# Patient Record
Sex: Female | Born: 2000
Health system: Southern US, Community
[De-identification: ages and names within clinical notes are randomized; demographics above are authoritative.]

## PROBLEM LIST (undated history)

## (undated) DIAGNOSIS — R630 Anorexia: Secondary | ICD-10-CM

## (undated) DIAGNOSIS — F329 Major depressive disorder, single episode, unspecified: Secondary | ICD-10-CM

## (undated) DIAGNOSIS — R519 Headache, unspecified: Secondary | ICD-10-CM

## (undated) DIAGNOSIS — G43909 Migraine, unspecified, not intractable, without status migrainosus: Secondary | ICD-10-CM

## (undated) DIAGNOSIS — F509 Eating disorder, unspecified: Secondary | ICD-10-CM

## (undated) DIAGNOSIS — N390 Urinary tract infection, site not specified: Secondary | ICD-10-CM

## (undated) DIAGNOSIS — G8929 Other chronic pain: Secondary | ICD-10-CM

## (undated) DIAGNOSIS — T7840XA Allergy, unspecified, initial encounter: Secondary | ICD-10-CM

## (undated) DIAGNOSIS — B2790 Infectious mononucleosis, unspecified without complication: Secondary | ICD-10-CM

## (undated) DIAGNOSIS — Z9889 Other specified postprocedural states: Secondary | ICD-10-CM

## (undated) DIAGNOSIS — R112 Nausea with vomiting, unspecified: Secondary | ICD-10-CM

## (undated) HISTORY — PX: TONSILLECTOMY AND ADENOIDECTOMY: SUR1326

## (undated) HISTORY — DX: Infectious mononucleosis, unspecified without complication: B27.90

## (undated) HISTORY — DX: Anorexia: R63.0

## (undated) HISTORY — DX: Urinary tract infection, site not specified: N39.0

## (undated) HISTORY — DX: Headache, unspecified: R51.9

## (undated) HISTORY — DX: Migraine, unspecified, not intractable, without status migrainosus: G43.909

## (undated) HISTORY — PX: FOOT SURGERY: SHX648

## (undated) HISTORY — DX: Allergy, unspecified, initial encounter: T78.40XA

## (undated) HISTORY — PX: TONSILLECTOMY: SUR1361

## (undated) HISTORY — DX: Other chronic pain: G89.29

## (undated) HISTORY — PX: TYMPANOSTOMY TUBE PLACEMENT: SHX32

## (undated) HISTORY — PX: WISDOM TOOTH EXTRACTION: SHX21

---
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
## 2020-01-05 ENCOUNTER — Telehealth: Payer: Self-pay | Admitting: Internal Medicine

## 2020-01-05 NOTE — Telephone Encounter (Signed)
Left message to return call 

## 2020-01-05 NOTE — Telephone Encounter (Signed)
The patient is needing refills on her FLUoxetine (PROZAC) 20 MG capsule  and buPROPion (WELLBUTRIN XL) 150 MG 24 hr tablet. Please call the patient to establish what pharmacy to call near her school to send the refills.

## 2020-01-05 NOTE — Telephone Encounter (Signed)
Mychart has also been sent to ask patient which pharmacy near her school she wants her medication sent to.

## 2020-01-09 ENCOUNTER — Other Ambulatory Visit: Payer: Self-pay

## 2020-01-09 ENCOUNTER — Other Ambulatory Visit: Payer: Self-pay | Admitting: *Deleted

## 2020-01-09 DIAGNOSIS — F32A Depression, unspecified: Secondary | ICD-10-CM

## 2020-01-09 DIAGNOSIS — F419 Anxiety disorder, unspecified: Secondary | ICD-10-CM

## 2020-01-09 MED ORDER — BUPROPION HCL ER (XL) 150 MG PO TB24: 150.0000 mg | ORAL_TABLET | Freq: Every day | ORAL | 1 refills | Status: DC

## 2020-01-09 MED ORDER — FLUOXETINE HCL 20 MG PO CAPS: 20.0000 mg | ORAL_CAPSULE | Freq: Every day | ORAL | 0 refills | Status: DC

## 2020-01-09 NOTE — Telephone Encounter (Signed)
Medication has been ordered to [facility] with instructions to come in for a face to face visit before they receive another refill.

## 2020-01-09 NOTE — Telephone Encounter (Signed)
Patient is coming home so please send medication to [facility].

## 2020-01-13 ENCOUNTER — Ambulatory Visit (INDEPENDENT_AMBULATORY_CARE_PROVIDER_SITE_OTHER): Payer: BC Managed Care – PPO | Admitting: Obstetrics & Gynecology

## 2020-01-13 ENCOUNTER — Other Ambulatory Visit: Payer: Self-pay

## 2020-01-13 ENCOUNTER — Encounter: Payer: Self-pay | Admitting: Obstetrics & Gynecology

## 2020-01-13 ENCOUNTER — Other Ambulatory Visit (HOSPITAL_COMMUNITY)
Admission: RE | Admit: 2020-01-13 | Discharge: 2020-01-13 | Disposition: A | Discharge status: Home / Self Care | Payer: BC Managed Care – PPO | Source: Ambulatory Visit | Attending: Obstetrics & Gynecology | Admitting: Obstetrics & Gynecology

## 2020-01-13 ENCOUNTER — Other Ambulatory Visit: Payer: Self-pay | Admitting: Obstetrics & Gynecology

## 2020-01-13 VITALS — BP 106/70 | HR 82 | Ht 63.0 in | Wt 138.6 lb

## 2020-01-13 DIAGNOSIS — T8332XA Displacement of intrauterine contraceptive device, initial encounter: Secondary | ICD-10-CM

## 2020-01-13 DIAGNOSIS — N921 Excessive and frequent menstruation with irregular cycle: Secondary | ICD-10-CM | POA: Diagnosis not present

## 2020-01-13 DIAGNOSIS — Z23 Encounter for immunization: Secondary | ICD-10-CM

## 2020-01-13 DIAGNOSIS — Z113 Encounter for screening for infections with a predominantly sexual mode of transmission: Secondary | ICD-10-CM | POA: Insufficient documentation

## 2020-01-13 DIAGNOSIS — Z0000 Encounter for general adult medical examination without abnormal findings: Secondary | ICD-10-CM

## 2020-01-13 DIAGNOSIS — E559 Vitamin D deficiency, unspecified: Secondary | ICD-10-CM

## 2020-01-13 DIAGNOSIS — N939 Abnormal uterine and vaginal bleeding, unspecified: Secondary | ICD-10-CM | POA: Insufficient documentation

## 2020-01-13 DIAGNOSIS — Z975 Presence of (intrauterine) contraceptive device: Secondary | ICD-10-CM

## 2020-01-13 LAB — CBC
Hematocrit: 40.1 % (ref 34.0–46.6)
Hemoglobin: 13.6 g/dL (ref 11.1–15.9)
MCH: 29.6 pg (ref 26.6–33.0)
MCHC: 33.9 g/dL (ref 31.5–35.7)
MCV: 87 fL (ref 79–97)
Platelets: 331 10*3/uL (ref 150–450)
RBC: 4.59 x10E6/uL (ref 3.77–5.28)
RDW: 11.7 % (ref 11.7–15.4)
WBC: 6.4 10*3/uL (ref 3.4–10.8)

## 2020-01-13 MED ORDER — NORETHINDRONE-ETH ESTRADIOL 1-35 MG-MCG PO TABS: 1.0000 | ORAL_TABLET | Freq: Every day | ORAL | 11 refills | Status: DC

## 2020-01-13 NOTE — Progress Notes (Signed)
GYNECOLOGY OFFICE VISIT NOTE  History:   Andrea Stevens is a 19 y.o. G0P0000 here today for evaluation of continued AUB despite having [name] in place since 01/2019 and initiation of continuous Loestrin Fe in 09/2019.  Was seen in 10/2019 and had no bleeding, but AUB resumed later that month.  Reports bleeding about 5-6 days/week with 2-3 days of heavy bleeding where she uses a pad every 1.5 hours. Has associated cramping. Feels tired, wants to make sure she is not anemic. Reports having spotting today. She denies any other concerns.    Past Medical History:  Diagnosis Date  . Allergy   . Anorexia    age 48 yo as of 19 y.o not active   . Chronic headaches   . Depression, major, single episode   . Eating disorder 2015   resolved.  . Migraines   . PONV (postoperative nausea and vomiting)     Past Surgical History:  Procedure Laterality Date  . FOOT SURGERY     tailors bunion 2019  . GANGLION CYST EXCISION Left 07/30/2018   Procedure: REMOVAL GANGLION OF LEFT DORSAL WRIST;  Surgeon: Leanora Cover, MD;  Location: [place];  Service: Orthopedics;  Laterality: Left;  bier block  . TONSILLECTOMY     2008  . TONSILLECTOMY AND ADENOIDECTOMY    . TYMPANOSTOMY TUBE PLACEMENT    . WISDOM TOOTH EXTRACTION      The following portions of the patient's history were reviewed and updated as appropriate: allergies, current medications, past family history, past medical history, past social history, past surgical history and problem list.    Review of Systems:  Pertinent items noted in HPI and remainder of comprehensive ROS otherwise negative.  Physical Exam:  BP 106/70   Pulse 82   Ht 5\' 3"  (1.6 m)   Wt 138 lb 9.6 oz (62.9 kg)   BMI 24.55 kg/m  CONSTITUTIONAL: Well-developed, well-nourished female in no acute distress.  HEENT:  Normocephalic, atraumatic. External right and left ear normal. No scleral icterus.  NECK: Normal range of motion, supple, no masses noted on  observation SKIN: No rash noted. Not diaphoretic. No erythema. No pallor. MUSCULOSKELETAL: Normal range of motion. No edema noted. NEUROLOGIC: Alert and oriented to person, place, and time. Normal muscle tone coordination. No cranial nerve deficit noted. PSYCHIATRIC: Normal mood and affect. Normal behavior. Normal judgment and thought content. CARDIOVASCULAR: Normal heart rate noted RESPIRATORY: Effort and breath sounds normal, no problems with respiration noted ABDOMEN: No masses noted. No other overt distention noted.   PELVIC: Normal appearing external genitalia; normal urethral meatus; normal appearing vaginal mucosa and cervix.  No abnormal discharge or blood seen noted. IUD strings not visualized.  Performed in the presence of a chaperone     Assessment and Plan:     1. Breakthrough bleeding with IUD 2. Abnormal uterine bleeding (AUB) 3. Intrauterine contraceptive device threads lost, initial encounter IUD strings not seen, ultrasound ordered for this and for evaluation of IUD intrauterine position or other structural anomalies. Infection screening done. Will check CBC, ferritin levels.  Will change OCPs to 35 mcg estradiol formulation, not continuous this time to try to get periodic controlled bleeding. Reevaluate in 2 months - Cervicovaginal ancillary only - US PELVIS (TRANSABDOMINAL ONLY); Future - CBC - Ferritin - norethindrone-ethinyl estradiol 1/35 (ORTHO-NOVUM) tablet; Take 1 tablet by mouth daily.  Dispense: 28 tablet; Refill: 11  4. Needs flu shot Flu shot given  5. Vitamin D deficiency - VITAMIN D  25 Hydroxy (Vit-D Deficiency, Fractures) checked given her history  6. Preventative health care - CBC - TSH - Comprehensive metabolic panel - Lipid panel Labs check as per her request.  Will follow up results and manage accordingly. Routine preventative health maintenance measures emphasized. Please refer to After Visit Summary for other counseling recommendations.    Return in about 2 months (around 03/14/2020) for any gynecologic concerns.    I spent 20 minutes dedicated to the care of this patient including pre-visit review of records, face to face time with the patient discussing her conditions and treatments and post visit ordering of testing.    Verita Schneiders, MD, [place] for [facility], [place]

## 2020-01-14 LAB — COMPREHENSIVE METABOLIC PANEL
ALT: 10 IU/L (ref 0–32)
AST: 17 IU/L (ref 0–40)
Albumin/Globulin Ratio: 1.6 (ref 1.2–2.2)
Albumin: 4.4 g/dL (ref 3.9–5.0)
Alkaline Phosphatase: 62 IU/L (ref 42–106)
BUN/Creatinine Ratio: 13 (ref 9–23)
BUN: 11 mg/dL (ref 6–20)
Bilirubin Total: 0.3 mg/dL (ref 0.0–1.2)
CO2: 22 mmol/L (ref 20–29)
Calcium: 9.9 mg/dL (ref 8.7–10.2)
Chloride: 103 mmol/L (ref 96–106)
Creatinine, Ser: 0.85 mg/dL (ref 0.57–1.00)
GFR calc Af Amer: 115 mL/min/{1.73_m2} (ref >59)
GFR calc non Af Amer: 100 mL/min/{1.73_m2} (ref >59)
Globulin, Total: 2.7 g/dL (ref 1.5–4.5)
Glucose: 81 mg/dL (ref 65–99)
Potassium: 4.4 mmol/L (ref 3.5–5.2)
Sodium: 139 mmol/L (ref 134–144)
Total Protein: 7.1 g/dL (ref 6.0–8.5)

## 2020-01-14 LAB — CERVICOVAGINAL ANCILLARY ONLY
Bacterial Vaginitis (gardnerella): NEGATIVE
Candida Glabrata: NEGATIVE
Candida Vaginitis: NEGATIVE
Chlamydia: NEGATIVE
Comment: NEGATIVE
Comment: NEGATIVE
Comment: NEGATIVE
Comment: NEGATIVE
Comment: NEGATIVE
Comment: NORMAL
Neisseria Gonorrhea: NEGATIVE
Trichomonas: NEGATIVE

## 2020-01-14 LAB — FERRITIN: Ferritin: 40 ng/mL (ref 15–77)

## 2020-01-14 LAB — LIPID PANEL
Chol/HDL Ratio: 3.6 ratio (ref 0.0–4.4)
Cholesterol, Total: 206 mg/dL — ABNORMAL HIGH (ref 100–169)
HDL: 57 mg/dL (ref >39)
LDL Chol Calc (NIH): 130 mg/dL — ABNORMAL HIGH (ref 0–109)
Triglycerides: 107 mg/dL — ABNORMAL HIGH (ref 0–89)
VLDL Cholesterol Cal: 19 mg/dL (ref 5–40)

## 2020-01-14 LAB — VITAMIN D 25 HYDROXY (VIT D DEFICIENCY, FRACTURES): Vit D, 25-Hydroxy: 35.4 ng/mL (ref 30.0–100.0)

## 2020-01-14 LAB — TSH: TSH: 2.52 u[IU]/mL (ref 0.450–4.500)

## 2020-01-16 ENCOUNTER — Other Ambulatory Visit: Payer: Self-pay

## 2020-01-16 ENCOUNTER — Ambulatory Visit
Admission: RE | Admit: 2020-01-16 | Discharge: 2020-01-16 | Disposition: A | Discharge status: Home / Self Care | Payer: BC Managed Care – PPO | Source: Ambulatory Visit | Attending: Obstetrics & Gynecology | Admitting: Obstetrics & Gynecology

## 2020-01-16 DIAGNOSIS — N939 Abnormal uterine and vaginal bleeding, unspecified: Secondary | ICD-10-CM | POA: Insufficient documentation

## 2020-01-16 DIAGNOSIS — T8332XA Displacement of intrauterine contraceptive device, initial encounter: Secondary | ICD-10-CM | POA: Diagnosis present

## 2020-01-16 DIAGNOSIS — N921 Excessive and frequent menstruation with irregular cycle: Secondary | ICD-10-CM | POA: Insufficient documentation

## 2020-01-16 DIAGNOSIS — Z975 Presence of (intrauterine) contraceptive device: Secondary | ICD-10-CM | POA: Diagnosis present

## 2020-02-11 ENCOUNTER — Other Ambulatory Visit: Payer: Self-pay

## 2020-02-11 ENCOUNTER — Other Ambulatory Visit: Payer: Self-pay | Admitting: Internal Medicine

## 2020-02-11 ENCOUNTER — Ambulatory Visit: Payer: BC Managed Care – PPO | Admitting: Internal Medicine

## 2020-02-11 ENCOUNTER — Encounter: Payer: Self-pay | Admitting: Internal Medicine

## 2020-02-11 VITALS — BP 106/74 | HR 82 | Temp 98.6°F | Ht 63.0 in | Wt 135.4 lb

## 2020-02-11 DIAGNOSIS — Z0000 Encounter for general adult medical examination without abnormal findings: Secondary | ICD-10-CM

## 2020-02-11 DIAGNOSIS — N6452 Nipple discharge: Secondary | ICD-10-CM | POA: Diagnosis not present

## 2020-02-11 DIAGNOSIS — F419 Anxiety disorder, unspecified: Secondary | ICD-10-CM | POA: Diagnosis not present

## 2020-02-11 DIAGNOSIS — F32A Depression, unspecified: Secondary | ICD-10-CM | POA: Diagnosis not present

## 2020-02-11 LAB — POCT URINE PREGNANCY: Preg Test, Ur: NEGATIVE

## 2020-02-11 MED ORDER — FLUOXETINE HCL 20 MG PO CAPS: 20.0000 mg | ORAL_CAPSULE | Freq: Every day | ORAL | 3 refills | Status: DC

## 2020-02-11 MED ORDER — BUPROPION HCL ER (XL) 150 MG PO TB24: 150.0000 mg | ORAL_TABLET | Freq: Every day | ORAL | 3 refills | Status: DC

## 2020-02-11 NOTE — Patient Instructions (Signed)
Galactorrhea Galactorrhea is an abnormal milky discharge from the breast. The discharge may come from one or both nipples. The fluid is often white, yellow, or green. It is different from the normal milk produced in nursing mothers. Galactorrhea usually occurs in women, but it can sometimes affect men. Various things can cause galactorrhea, such as:  Irritation of the breast, which can result from injury, stimulation during sexual activity, or clothes rubbing against the nipple.  Medicines.  Changes in hormone levels. In many cases, galactorrhea will go away without treatment. However, galactorrhea can also be a sign of something more serious, such as diseases of the kidney or thyroid, or problems with the pituitary gland. Your health care provider may do various tests to help determine the cause. Sometimes the cause is unknown. It is important to monitor your condition to make sure that it goes away. Follow these instructions at home:  Breast care  Watch your condition for any changes.  Do not squeeze your breasts or nipples.  Avoid breast stimulation during sexual activity.  Perform a breast self-exam once a month. Doing this more often can irritate your breasts.  Avoid clothes that rub on your nipples.  Use breast pads to absorb the discharge.  Wear a breast binder or a support bra to help prevent clothes from rubbing on your nipples. General instructions  Take over-the-counter and prescription medicines only as told by your health care provider.  Keep all follow-up visits as told by your health care provider. This is important. Contact a health care provider if you:  Develop hot flashes, vaginal dryness, or a lack of sexual desire.  Stop having menstrual periods, or they are irregular or far apart.  Have headaches.  Have vision problems. Get help right away if you:  Have breast discharge that is bloody or pus-like.  Have breast pain.  Feel a lump in your  breast.  Have wrinkling or dimpling on your breast.  Notice that your breast becomes red and swollen. Summary  Galactorrhea is an abnormal milky discharge from the breast. The fluid may come from one or both nipples and is often white, yellow, or green.  Galactorrhea may be caused by various things, such as irritation of the nipples, medicines, or changes in hormone levels.  Galactorrhea often goes away without treatment. However, it also may be a sign of something more serious, such as diseases of the kidney or thyroid, or problems with the pituitary gland.  Get help right away if you have discharge that is bloody or pus-like, if you have breast pain or a lump, or if you have skin changes on your breast. This information is not intended to replace advice given to you by your health care provider. Make sure you discuss any questions you have with your health care provider. Document Revised: 01/19/2017 Document Reviewed: 01/03/2017 Elsevier Patient Education  2020 [employer]    

## 2020-02-11 NOTE — Progress Notes (Signed)
Chief Complaint  Patient presents with  . Breast Problem    Discharge from the right breast nipple   Annual   Right nipple discharge since 02/09/20 and nipple sore 1st time with clear to white discharge   Anxiety/depression improved on wellbutrin 150 mg  Xl, prozac 20 mg qd   Review of Systems  Constitutional: Negative for weight loss.  HENT: Negative for hearing loss.   Eyes: Negative for blurred vision.  Respiratory: Negative for shortness of breath.   Cardiovascular: Negative for chest pain.  Gastrointestinal: Negative for abdominal pain.  Musculoskeletal: Negative for falls.  Skin: Negative for rash.  Neurological: Negative for headaches.  Psychiatric/Behavioral: Negative for depression. The patient is not nervous/anxious.    Past Medical History:  Diagnosis Date  . Allergy   . Anorexia    age 67 yo as of 19 y.o not active   . Chronic headaches   . Depression, major, single episode   . Eating disorder 2015   resolved.  . Migraines   . PONV (postoperative nausea and vomiting)    Past Surgical History:  Procedure Laterality Date  . FOOT SURGERY     tailors bunion 2019  . GANGLION CYST EXCISION Left 07/30/2018   Procedure: REMOVAL GANGLION OF LEFT DORSAL WRIST;  Surgeon: Leanora Cover, MD;  Location: [facility];  Service: Orthopedics;  Laterality: Left;  bier block  . TONSILLECTOMY     2008  . TONSILLECTOMY AND ADENOIDECTOMY    . TYMPANOSTOMY TUBE PLACEMENT    . WISDOM TOOTH EXTRACTION     Family History  Problem Relation Age of Onset  . Diabetes Mother        type 1  . Heart disease Mother   . Arthritis Mother   . Learning disabilities Mother   . Miscarriages / [place] Mother   . Allergies Father   . Kidney disease Father   . Hypertension Maternal Grandmother   . Anxiety disorder Maternal Grandmother   . Hyperlipidemia Maternal Grandmother   . Mental illness Maternal Grandmother   . Hypertension Maternal Grandfather   . Arthritis  Maternal Grandfather   . Drug abuse Maternal Grandfather   . Hearing loss Maternal Grandfather   . Heart disease Maternal Grandfather   . Hyperlipidemia Maternal Grandfather   . Heart disease Paternal Grandmother   . Arthritis Paternal Grandmother   . Hypertension Paternal Grandmother   . Cancer Paternal Grandfather        skin melanoma   Social History   Socioeconomic History  . Marital status: Single    Spouse name: Not on file  . Number of children: Not on file  . Years of education: Not on file  . Highest education level: Not on file  Occupational History  . Not on file  Tobacco Use  . Smoking status: Never Smoker  . Smokeless tobacco: Never Used  Vaping Use  . Vaping Use: Never used  Substance and Sexual Activity  . Alcohol use: No  . Drug use: No  . Sexual activity: Never    Birth control/protection: I.U.D.  Other Topics Concern  . Not on file  Social History Narrative   1 twin sister Lenna Sciara    Lives with sister and mom    Moved from [place]    In [employer] interested in medicine    Social Determinants of Health   Financial Resource Strain: Not on file  Food Insecurity: Not on file  Transportation Needs: Not on file  Physical Activity: Not  on file  Stress: Not on file  Social Connections: Not on file  Intimate Partner Violence: Not on file   Current Meds  Medication Sig  . Cholecalciferol 1.25 MG (50000 UT) capsule Take 1 capsule (50,000 Units total) by mouth once a week.  . Ferrous Sulfate (IRON PO) Take by mouth.  . fluticasone (FLONASE) 50 MCG/ACT nasal spray Place into both nostrils daily.  . Levonorgestrel ([name]) 19.5 MG IUD by Intrauterine route.  . norethindrone-ethinyl estradiol 1/35 (ORTHO-NOVUM) tablet Take 1 tablet by mouth daily.  Marland Kitchen tretinoin (RETIN-A) 0.025 % cream   . [DISCONTINUED] buPROPion (WELLBUTRIN XL) 150 MG 24 hr tablet Take 1 tablet (150 mg total) by mouth daily. Needs an appointment before next refill.  . [DISCONTINUED] FLUoxetine  (PROZAC) 20 MG capsule Take 1 capsule (20 mg total) by mouth daily. Needs an appointment before next refill.   Allergies  Allergen Reactions  . Fentanyl     Nausea   . Versed [Midazolam]     nausea   Recent Results (from the past 2160 hour(s))  Cervicovaginal ancillary only     Status: None   Collection Time: 01/13/20 11:33 AM  Result Value Ref Range   Neisseria Gonorrhea Negative    Chlamydia Negative    Trichomonas Negative    Bacterial Vaginitis (gardnerella) Negative    Candida Vaginitis Negative    Candida Glabrata Negative    Comment      Normal Reference Range Bacterial Vaginosis - Negative   Comment Normal Reference Range Candida Species - Negative    Comment Normal Reference Range Candida Galbrata - Negative    Comment Normal Reference Range Trichomonas - Negative    Comment Normal Reference Ranger Chlamydia - Negative    Comment      Normal Reference Range Neisseria Gonorrhea - Negative  CBC     Status: None   Collection Time: 01/13/20  3:06 PM  Result Value Ref Range   WBC 6.4 3.4 - 10.8 x10E3/uL   RBC 4.59 3.77 - 5.28 x10E6/uL   Hemoglobin 13.6 11.1 - 15.9 g/dL   Hematocrit 40.1 34.0 - 46.6 %   MCV 87 79 - 97 fL   MCH 29.6 26.6 - 33.0 pg   MCHC 33.9 31.5 - 35.7 g/dL   RDW 11.7 11.7 - 15.4 %   Platelets 331 150 - 450 x10E3/uL  TSH     Status: None   Collection Time: 01/13/20  3:08 PM  Result Value Ref Range   TSH 2.520 0.450 - 4.500 uIU/mL  Comprehensive metabolic panel     Status: None   Collection Time: 01/13/20  3:08 PM  Result Value Ref Range   Glucose 81 65 - 99 mg/dL   BUN 11 6 - 20 mg/dL   Creatinine, Ser 0.85 0.57 - 1.00 mg/dL   GFR calc non Af Amer 100 >59 mL/min/1.73   GFR calc Af Amer 115 >59 mL/min/1.73    Comment: In accordance with recommendations from the NKF-ASN Task force   [facility] is in the process of updating its eGFR calculation to the   2021 CKD-EPI creatinine equation that estimates kidney function   without a race variable.     BUN/Creatinine Ratio 13 9 - 23   Sodium 139 134 - 144 mmol/L   Potassium 4.4 3.5 - 5.2 mmol/L   Chloride 103 96 - 106 mmol/L   CO2 22 20 - 29 mmol/L   Calcium 9.9 8.7 - 10.2 mg/dL   Total Protein 7.1 6.0 -  8.5 g/dL   Albumin 4.4 3.9 - 5.0 g/dL   Globulin, Total 2.7 1.5 - 4.5 g/dL   Albumin/Globulin Ratio 1.6 1.2 - 2.2   Bilirubin Total 0.3 0.0 - 1.2 mg/dL   Alkaline Phosphatase 62 42 - 106 IU/L    Comment:              Please note reference interval change   AST 17 0 - 40 IU/L   ALT 10 0 - 32 IU/L  Lipid panel     Status: Abnormal   Collection Time: 01/13/20  3:08 PM  Result Value Ref Range   Cholesterol, Total 206 (H) 100 - 169 mg/dL   Triglycerides 107 (H) 0 - 89 mg/dL   HDL 57 >39 mg/dL   VLDL Cholesterol Cal 19 5 - 40 mg/dL   LDL Chol Calc (NIH) 130 (H) 0 - 109 mg/dL   Chol/HDL Ratio 3.6 0.0 - 4.4 ratio    Comment:                                   T. Chol/HDL Ratio                                             Men  Women                               1/2 Avg.Risk  3.4    3.3                                   Avg.Risk  5.0    4.4                                2X Avg.Risk  9.6    7.1                                3X Avg.Risk 23.4   11.0   Ferritin     Status: None   Collection Time: 01/13/20  3:11 PM  Result Value Ref Range   Ferritin 40 15 - 77 ng/mL  VITAMIN D 25 Hydroxy (Vit-D Deficiency, Fractures)     Status: None   Collection Time: 01/13/20  3:11 PM  Result Value Ref Range   Vit D, 25-Hydroxy 35.4 30.0 - 100.0 ng/mL    Comment: Vitamin D deficiency has been defined by the [facility] practice guideline as a level of serum 25-OH vitamin D less than 20 ng/mL (1,2). The Endocrine Society went on to further define vitamin D insufficiency as a level between 21 and 29 ng/mL (2). 1. IOM (Institute of Medicine). 2010. Dietary reference    intakes for calcium and D. [place]: The    [employer]. 2. Holick MF, Binkley  [place], Bischoff-Ferrari HA, et al.    Evaluation, treatment, and prevention of vitamin D    deficiency: an Endocrine Society clinical practice    guideline. JCEM. 2011 Jul; 96(7):1911-30.    Objective  Body mass index is 23.99 kg/m. Wt Readings  from Last 3 Encounters:  02/11/20 135 lb 6.4 oz (61.4 kg) (64 %, Z= 0.37)*  01/13/20 138 lb 9.6 oz (62.9 kg) (69 %, Z= 0.50)*  09/25/19 136 lb (61.7 kg) (67 %, Z= 0.44)*   * Growth percentiles are based on CDC (Girls, 2-20 Years) data.   Temp Readings from Last 3 Encounters:  02/11/20 98.6 F (37 C) (Oral)  10/17/19 98.7 F (37.1 C)  08/06/19 97.9 F (36.6 C) (Temporal)   BP Readings from Last 3 Encounters:  02/11/20 106/74  01/13/20 106/70  10/17/19 106/70   Pulse Readings from Last 3 Encounters:  02/11/20 82  01/13/20 82  10/17/19 76    Physical Exam Vitals and nursing note reviewed.  Constitutional:      Appearance: Normal appearance. She is well-developed and well-groomed.  HENT:     Head: Normocephalic and atraumatic.  Eyes:     Conjunctiva/sclera: Conjunctivae normal.     Pupils: Pupils are equal, round, and reactive to light.  Cardiovascular:     Rate and Rhythm: Normal rate and regular rhythm.     Heart sounds: Normal heart sounds. No murmur heard.   Chest:  Breasts:     Right: Nipple discharge present.      Comments: Clear to white discharge Abdominal:     General: Abdomen is flat. Bowel sounds are normal.  Skin:    General: Skin is warm and dry.  Neurological:     General: No focal deficit present.     Mental Status: She is alert and oriented to person, place, and time. Mental status is at baseline.     Gait: Gait normal.  Psychiatric:        Attention and Perception: Attention and perception normal.        Mood and Affect: Mood and affect normal.        Speech: Speech normal.        Behavior: Behavior normal. Behavior is cooperative.        Thought Content: Thought content normal.        Cognition and  Memory: Cognition and memory normal.        Judgment: Judgment normal.     Assessment  Plan   Annual Fasting labs utd 12/2019 Flu shot given utd  tdap had 01/16/11  covid 2/2 pfizer consider booster   F/u ob/gyn Dr. Gala Romney heavy menses as of 08/06/19 still having on provera   Referred dermatology prev left forehead lesion, acne skin cancer checkgiven treitinoin   rec healthy diet and exercise  Consider hep C/HIV testing in future  Discharge from right nipple - Plan: US BREAST LTD UNI RIGHT INC AXILLA, Prolactin, POCT urine pregnancy negative   Anxiety and depression - Plan: FLUoxetine (PROZAC) 20 MG capsule, buPROPion (WELLBUTRIN XL) 150 MG 24 hr tablet GAD 7 and PHQ 9 given today   specialists Ob/gyn Dr. Gala Romney  Eye Dr. Annamaria Boots  Dentist Dr. Georgie Chard  Prior PCP Dr. Bertell Maria [place]  Provider: Dr. Olivia Mackie McLean-Scocuz[facility]

## 2020-02-11 NOTE — Progress Notes (Signed)
Patient presenting with discharge from the right breast nipple. Ongoing for 2 days. Discharge has been clear but last night had a spot of white. Breast is not painful but is sore.  Patient is currently on oral contraceptives along with an IUD placed.

## 2020-02-12 ENCOUNTER — Other Ambulatory Visit (INDEPENDENT_AMBULATORY_CARE_PROVIDER_SITE_OTHER): Payer: BC Managed Care – PPO

## 2020-02-12 ENCOUNTER — Other Ambulatory Visit: Payer: BC Managed Care – PPO

## 2020-02-12 ENCOUNTER — Ambulatory Visit
Admission: RE | Admit: 2020-02-12 | Discharge: 2020-02-12 | Disposition: A | Discharge status: Home / Self Care | Payer: BC Managed Care – PPO | Source: Ambulatory Visit | Attending: Internal Medicine | Admitting: Internal Medicine

## 2020-02-12 DIAGNOSIS — E559 Vitamin D deficiency, unspecified: Secondary | ICD-10-CM | POA: Diagnosis not present

## 2020-02-12 DIAGNOSIS — E538 Deficiency of other specified B group vitamins: Secondary | ICD-10-CM | POA: Diagnosis not present

## 2020-02-12 DIAGNOSIS — Z0000 Encounter for general adult medical examination without abnormal findings: Secondary | ICD-10-CM | POA: Diagnosis not present

## 2020-02-12 DIAGNOSIS — Z1329 Encounter for screening for other suspected endocrine disorder: Secondary | ICD-10-CM | POA: Diagnosis not present

## 2020-02-12 DIAGNOSIS — Z1389 Encounter for screening for other disorder: Secondary | ICD-10-CM

## 2020-02-12 DIAGNOSIS — N6452 Nipple discharge: Secondary | ICD-10-CM

## 2020-02-12 LAB — COMPREHENSIVE METABOLIC PANEL
ALT: 14 U/L (ref 0–35)
AST: 14 U/L (ref 0–37)
Albumin: 4 g/dL (ref 3.5–5.2)
Alkaline Phosphatase: 50 U/L (ref 47–119)
BUN: 11 mg/dL (ref 6–23)
CO2: 24 mEq/L (ref 19–32)
Calcium: 8.8 mg/dL (ref 8.4–10.5)
Chloride: 104 mEq/L (ref 96–112)
Creatinine, Ser: 0.72 mg/dL (ref 0.40–1.20)
GFR: 121.32 mL/min (ref >60.00)
Glucose, Bld: 96 mg/dL (ref 70–99)
Potassium: 3.5 mEq/L (ref 3.5–5.1)
Sodium: 136 mEq/L (ref 135–145)
Total Bilirubin: 0.2 mg/dL (ref 0.2–1.2)
Total Protein: 6.6 g/dL (ref 6.0–8.3)

## 2020-02-12 LAB — CBC WITH DIFFERENTIAL/PLATELET
Basophils Absolute: 0 10*3/uL (ref 0.0–0.1)
Basophils Relative: 0.5 % (ref 0.0–3.0)
Eosinophils Absolute: 0.2 10*3/uL (ref 0.0–0.7)
Eosinophils Relative: 3.4 % (ref 0.0–5.0)
HCT: 39.4 % (ref 36.0–49.0)
Hemoglobin: 13.1 g/dL (ref 12.0–16.0)
Lymphocytes Relative: 35.8 % (ref 24.0–48.0)
Lymphs Abs: 2.5 10*3/uL (ref 0.7–4.0)
MCHC: 33.3 g/dL (ref 31.0–37.0)
MCV: 87 fl (ref 78.0–98.0)
Monocytes Absolute: 0.5 10*3/uL (ref 0.1–1.0)
Monocytes Relative: 7.3 % (ref 3.0–12.0)
Neutro Abs: 3.6 10*3/uL (ref 1.4–7.7)
Neutrophils Relative %: 53 % (ref 43.0–71.0)
Platelets: 287 10*3/uL (ref 150.0–575.0)
RBC: 4.53 Mil/uL (ref 3.80–5.70)
RDW: 12.5 % (ref 11.4–15.5)
WBC: 6.9 10*3/uL (ref 4.5–13.5)

## 2020-02-12 LAB — TSH: TSH: 1.41 u[IU]/mL (ref 0.40–5.00)

## 2020-02-12 LAB — VITAMIN D 25 HYDROXY (VIT D DEFICIENCY, FRACTURES): VITD: 31.46 ng/mL (ref 30.00–100.00)

## 2020-02-12 LAB — VITAMIN B12: Vitamin B-12: 148 pg/mL — ABNORMAL LOW (ref 211–911)

## 2020-02-13 LAB — URINALYSIS, ROUTINE W REFLEX MICROSCOPIC
Bacteria, UA: NONE SEEN /HPF
Bilirubin Urine: NEGATIVE
Glucose, UA: NEGATIVE
Ketones, ur: NEGATIVE
Nitrite: NEGATIVE
Protein, ur: NEGATIVE
RBC / HPF: NONE SEEN /HPF (ref 0–2)
Specific Gravity, Urine: 1.028 (ref 1.001–1.03)
pH: 5.5 (ref 5.0–8.0)

## 2020-02-13 LAB — PROLACTIN: Prolactin: 7.9 ng/mL

## 2020-02-16 ENCOUNTER — Telehealth: Payer: Self-pay | Admitting: Internal Medicine

## 2020-02-16 NOTE — Telephone Encounter (Signed)
Left message to return call 

## 2020-02-16 NOTE — Telephone Encounter (Signed)
-----   Message from Bevelyn Buckles, MD sent at 02/16/2020  9:10 AM EST ----- Some blood in urine  Any uti sx's? Does she vaginal bleeding?   Prolactin lab normal no elevated prolactin as cause of nipple discharge could be medications  US breast indicates this is benign and not of concern   Vitamin D low normal rec D3 4000 Iu daily  B12 low  -rec B12 injections Q30 days or pill 1000 mcg daily what does she want to do? -if she is sch for visit here she can learn how do do and do at home injections  What does she want to do?   Thyroid lab normal

## 2020-02-23 ENCOUNTER — Other Ambulatory Visit: Payer: Self-pay

## 2020-02-23 ENCOUNTER — Encounter: Payer: Self-pay | Admitting: Family Medicine

## 2020-02-23 ENCOUNTER — Ambulatory Visit (INDEPENDENT_AMBULATORY_CARE_PROVIDER_SITE_OTHER): Payer: BC Managed Care – PPO | Admitting: Family Medicine

## 2020-02-23 VITALS — BP 112/74 | HR 82 | Ht 63.0 in | Wt 138.0 lb

## 2020-02-23 DIAGNOSIS — N938 Other specified abnormal uterine and vaginal bleeding: Secondary | ICD-10-CM | POA: Diagnosis not present

## 2020-02-23 DIAGNOSIS — N643 Galactorrhea not associated with childbirth: Secondary | ICD-10-CM

## 2020-02-23 NOTE — Assessment & Plan Note (Signed)
Finally not bleeding with [name] and Sprintec.

## 2020-02-23 NOTE — Progress Notes (Signed)
    Subjective:    Patient ID: Andrea Stevens is a 20 y.o. female presenting with Breast Problem  on 02/23/2020  HPI: Has long h/o abnormal bleeding, now responding to [place] and [employer]. Had clear nipple leaking with placebos last time. With normal work-up and normal u/s. Since her cycle control is so much better, she prefers to not change this up.  Review of Systems  Constitutional: Negative for chills and fever.  Respiratory: Negative for shortness of breath.   Cardiovascular: Negative for chest pain.  Gastrointestinal: Negative for abdominal pain, nausea and vomiting.  Genitourinary: Negative for dysuria.  Skin: Negative for rash.      Objective:    BP 112/74   Pulse 82   Ht 5\' 3"  (1.6 m)   Wt 138 lb (62.6 kg)   LMP 02/04/2020 (Within Days)   BMI 24.45 kg/m  Physical Exam Constitutional:      General: She is not in acute distress.    Appearance: She is well-developed and well-nourished.  HENT:     Head: Normocephalic and atraumatic.  Eyes:     General: No scleral icterus. Cardiovascular:     Rate and Rhythm: Normal rate.  Pulmonary:     Effort: Pulmonary effort is normal.  Abdominal:     Palpations: Abdomen is soft.  Musculoskeletal:     Cervical back: Neck supple.  Skin:    General: Skin is warm and dry.  Neurological:     Mental Status: She is alert and oriented to person, place, and time.  Psychiatric:        Mood and Affect: Mood and affect normal.         Assessment & Plan:   Problem List Items Addressed This Visit      Unprioritized   DUB (dysfunctional uterine bleeding)    Finally not bleeding with [name] and Sprintec.       Other Visit Diagnoses    Galactorrhea    -  Primary   Neg Prolactin, nml u/s, likely related to OC's. Will not change these, sleep with bra, use supportive sports bra to limit nipple stimulation.      Total time in review of prior notes, pathology, labs, history taking, review with patient, exam, note writing,  discussion of options, plan for next steps, alternatives and risks of treatment: 37 minutes.  Return in about 8 weeks (around 04/19/2020) for with Dr. 04/21/2020, virtual.  Mervyn Skeeters 02/23/2020 3:38 PM

## 2020-02-23 NOTE — Progress Notes (Signed)
Pt c/o white/clear discharge from right breast. Started 02/09/20 when taking placebo pack of bc.

## 2020-02-26 ENCOUNTER — Encounter: Payer: Self-pay | Admitting: Internal Medicine

## 2020-02-26 ENCOUNTER — Ambulatory Visit: Payer: BC Managed Care – PPO | Admitting: Internal Medicine

## 2020-02-26 ENCOUNTER — Other Ambulatory Visit: Payer: Self-pay

## 2020-02-26 VITALS — BP 100/68 | HR 89 | Temp 98.9°F | Resp 14 | Ht 63.0 in | Wt 136.8 lb

## 2020-02-26 DIAGNOSIS — R319 Hematuria, unspecified: Secondary | ICD-10-CM

## 2020-02-26 DIAGNOSIS — N643 Galactorrhea not associated with childbirth: Secondary | ICD-10-CM | POA: Diagnosis not present

## 2020-02-26 DIAGNOSIS — E538 Deficiency of other specified B group vitamins: Secondary | ICD-10-CM

## 2020-02-26 NOTE — Progress Notes (Signed)
Chief Complaint  Patient presents with  . Follow-up    2 week follow up    F/u  1. Right breast galactorrhea resolved for now [place] no etiology and ob/gyn thought likely 2/2 meds ocps 2. Reviewed labs b12 def on B12 500 mg qd urine with blood and wbc will check urine culture denies dysuria but increased freq urination   Review of Systems  Constitutional: Negative for weight loss.  HENT: Negative for hearing loss.   Eyes: Negative for blurred vision.  Respiratory: Negative for shortness of breath.   Cardiovascular: Negative for chest pain.  Gastrointestinal: Negative for abdominal pain.  Genitourinary:       Denies breast discharge  Skin: Negative for rash.   Past Medical History:  Diagnosis Date  . Allergy   . Anorexia    age 20 yo as of 20 y.o not active   . Chronic headaches   . Depression, major, single episode   . Eating disorder 2015   resolved.  . Migraines   . PONV (postoperative nausea and vomiting)    Past Surgical History:  Procedure Laterality Date  . FOOT SURGERY     tailors bunion 2019  . GANGLION CYST EXCISION Left 07/30/2018   Procedure: REMOVAL GANGLION OF LEFT DORSAL WRIST;  Surgeon: Leanora Cover, MD;  Location: [facility];  Service: Orthopedics;  Laterality: Left;  bier block  . TONSILLECTOMY     2008  . TONSILLECTOMY AND ADENOIDECTOMY    . TYMPANOSTOMY TUBE PLACEMENT    . WISDOM TOOTH EXTRACTION     Family History  Problem Relation Age of Onset  . Diabetes Mother        type 1  . Heart disease Mother   . Arthritis Mother   . Learning disabilities Mother   . Miscarriages / [place] Mother   . Allergies Father   . Kidney disease Father   . Hypertension Maternal Grandmother   . Anxiety disorder Maternal Grandmother   . Hyperlipidemia Maternal Grandmother   . Mental illness Maternal Grandmother   . Hypertension Maternal Grandfather   . Arthritis Maternal Grandfather   . Drug abuse Maternal Grandfather   . Hearing loss Maternal  Grandfather   . Heart disease Maternal Grandfather   . Hyperlipidemia Maternal Grandfather   . Heart disease Paternal Grandmother   . Arthritis Paternal Grandmother   . Hypertension Paternal Grandmother   . Cancer Paternal Grandfather        skin melanoma   Social History   Socioeconomic History  . Marital status: Single    Spouse name: Not on file  . Number of children: Not on file  . Years of education: Not on file  . Highest education level: Not on file  Occupational History  . Not on file  Tobacco Use  . Smoking status: Never Smoker  . Smokeless tobacco: Never Used  Vaping Use  . Vaping Use: Never used  Substance and Sexual Activity  . Alcohol use: No  . Drug use: No  . Sexual activity: Never    Birth control/protection: I.U.D.  Other Topics Concern  . Not on file  Social History Narrative   1 twin sister Lenna Sciara    Lives with sister and mom    Moved from [place]    In [employer] interested in medicine pre-med    Social Determinants of Health   Financial Resource Strain: Not on file  Food Insecurity: Not on file  Transportation Needs: Not on file  Physical Activity:  Not on file  Stress: Not on file  Social Connections: Not on file  Intimate Partner Violence: Not on file   Current Meds  Medication Sig  . buPROPion (WELLBUTRIN XL) 150 MG 24 hr tablet Take 1 tablet (150 mg total) by mouth daily.  . cholecalciferol (VITAMIN D3) 25 MCG (1000 UNIT) tablet Take 1,000 Units by mouth daily.  . Ferrous Sulfate (IRON PO) Take by mouth.  Marland Kitchen FLUoxetine (PROZAC) 20 MG capsule Take 1 capsule (20 mg total) by mouth daily.  . fluticasone (FLONASE) 50 MCG/ACT nasal spray Place into both nostrils daily.  . Levonorgestrel ([name]) 19.5 MG IUD by Intrauterine route.  . norethindrone-ethinyl estradiol 1/35 (ORTHO-NOVUM) tablet Take 1 tablet by mouth daily.  Marland Kitchen tretinoin (RETIN-A) 0.025 % cream   . vitamin B-12 (CYANOCOBALAMIN) 500 MCG tablet Take 500 mcg by mouth daily.   Allergies   Allergen Reactions  . Fentanyl     Nausea   . Versed [Midazolam]     nausea   Recent Results (from the past 2160 hour(s))  Cervicovaginal ancillary only     Status: None   Collection Time: 01/13/20 11:33 AM  Result Value Ref Range   Neisseria Gonorrhea Negative    Chlamydia Negative    Trichomonas Negative    Bacterial Vaginitis (gardnerella) Negative    Candida Vaginitis Negative    Candida Glabrata Negative    Comment      Normal Reference Range Bacterial Vaginosis - Negative   Comment Normal Reference Range Candida Species - Negative    Comment Normal Reference Range Candida Galbrata - Negative    Comment Normal Reference Range Trichomonas - Negative    Comment Normal Reference Ranger Chlamydia - Negative    Comment      Normal Reference Range Neisseria Gonorrhea - Negative  CBC     Status: None   Collection Time: 01/13/20  3:06 PM  Result Value Ref Range   WBC 6.4 3.4 - 10.8 x10E3/uL   RBC 4.59 3.77 - 5.28 x10E6/uL   Hemoglobin 13.6 11.1 - 15.9 g/dL   Hematocrit 40.1 34.0 - 46.6 %   MCV 87 79 - 97 fL   MCH 29.6 26.6 - 33.0 pg   MCHC 33.9 31.5 - 35.7 g/dL   RDW 11.7 11.7 - 15.4 %   Platelets 331 150 - 450 x10E3/uL  TSH     Status: None   Collection Time: 01/13/20  3:08 PM  Result Value Ref Range   TSH 2.520 0.450 - 4.500 uIU/mL  Comprehensive metabolic panel     Status: None   Collection Time: 01/13/20  3:08 PM  Result Value Ref Range   Glucose 81 65 - 99 mg/dL   BUN 11 6 - 20 mg/dL   Creatinine, Ser 0.85 0.57 - 1.00 mg/dL   GFR calc non Af Amer 100 >59 mL/min/1.73   GFR calc Af Amer 115 >59 mL/min/1.73    Comment: **In accordance with recommendations from the NKF-ASN Task force,**   [facility] is in the process of updating its eGFR calculation to the   2021 CKD-EPI creatinine equation that estimates kidney function   without a race variable.    BUN/Creatinine Ratio 13 9 - 23   Sodium 139 134 - 144 mmol/L   Potassium 4.4 3.5 - 5.2 mmol/L   Chloride 103 96 -  106 mmol/L   CO2 22 20 - 29 mmol/L   Calcium 9.9 8.7 - 10.2 mg/dL   Total Protein 7.1 6.0 - 8.5 g/dL  Albumin 4.4 3.9 - 5.0 g/dL   Globulin, Total 2.7 1.5 - 4.5 g/dL   Albumin/Globulin Ratio 1.6 1.2 - 2.2   Bilirubin Total 0.3 0.0 - 1.2 mg/dL   Alkaline Phosphatase 62 42 - 106 IU/L    Comment:               **Please note reference interval change**   AST 17 0 - 40 IU/L   ALT 10 0 - 32 IU/L  Lipid panel     Status: Abnormal   Collection Time: 01/13/20  3:08 PM  Result Value Ref Range   Cholesterol, Total 206 (H) 100 - 169 mg/dL   Triglycerides 500 (H) 0 - 89 mg/dL   HDL 57 >16 mg/dL   VLDL Cholesterol Cal 19 5 - 40 mg/dL   LDL Chol Calc (NIH) 429 (H) 0 - 109 mg/dL   Chol/HDL Ratio 3.6 0.0 - 4.4 ratio    Comment:                                   T. Chol/HDL Ratio                                             Men  Women                               1/2 Avg.Risk  3.4    3.3                                   Avg.Risk  5.0    4.4                                2X Avg.Risk  9.6    7.1                                3X Avg.Risk 23.4   11.0   Ferritin     Status: None   Collection Time: 01/13/20  3:11 PM  Result Value Ref Range   Ferritin 40 15 - 77 ng/mL  VITAMIN D 25 Hydroxy (Vit-D Deficiency, Fractures)     Status: None   Collection Time: 01/13/20  3:11 PM  Result Value Ref Range   Vit D, 25-Hydroxy 35.4 30.0 - 100.0 ng/mL    Comment: Vitamin D deficiency has been defined by the Institute of Medicine and an Endocrine Society practice guideline as a level of serum 25-OH vitamin D less than 20 ng/mL (1,2). The Endocrine Society went on to further define vitamin D insufficiency as a level between 21 and 29 ng/mL (2). 1. IOM (Institute of Medicine). 2010. Dietary reference    intakes for calcium and D. [place]: The    [employer]. 2. Holick MF, [place], Bischoff-Ferrari HA, et al.    Evaluation, treatment, and prevention of vitamin D    deficiency: an  Endocrine Society clinical practice    guideline. JCEM. 2011 Jul; 96(7):1911-30.   POCT urine pregnancy     Status: Normal   Collection Time: 02/11/20  3:21 PM  Result Value Ref Range   Preg Test, Ur Negative Negative  Prolactin     Status: None   Collection Time: 02/12/20  8:03 AM  Result Value Ref Range   Prolactin 7.9 ng/mL    Comment:             Reference Range  Females         Non-pregnant        3.0-30.0         Pregnant           10.0-209.0         Postmenopausal      2.0-20.0 . . .   Vitamin D (25 hydroxy)     Status: None   Collection Time: 02/12/20  8:03 AM  Result Value Ref Range   VITD 31.46 30.00 - 100.00 ng/mL  B12     Status: Abnormal   Collection Time: 02/12/20  8:03 AM  Result Value Ref Range   Vitamin B-12 148 (L) 211 - 911 pg/mL  Urinalysis, Routine w reflex microscopic     Status: Abnormal   Collection Time: 02/12/20  8:03 AM  Result Value Ref Range   Color, Urine DARK YELLOW YELLOW   APPearance CLEAR CLEAR   Specific Gravity, Urine 1.028 1.001 - 1.03   pH 5.5 5.0 - 8.0   Glucose, UA NEGATIVE NEGATIVE   Bilirubin Urine NEGATIVE NEGATIVE   Ketones, ur NEGATIVE NEGATIVE   Hgb urine dipstick 1+ (A) NEGATIVE   Protein, ur NEGATIVE NEGATIVE   Nitrite NEGATIVE NEGATIVE   Leukocytes,Ua TRACE (A) NEGATIVE   WBC, UA 6-10 (A) 0 - 5 /HPF   RBC / HPF NONE SEEN 0 - 2 /HPF   Squamous Epithelial / LPF 6-10 (A) < OR = 5 /HPF   Bacteria, UA NONE SEEN NONE SEEN /HPF   Hyaline Cast 0-5 (A) NONE SEEN /LPF   Urine-Other FEW MUCOUS THREADS   TSH     Status: None   Collection Time: 02/12/20  8:03 AM  Result Value Ref Range   TSH 1.41 0.40 - 5.00 uIU/mL  CBC with Differential/Platelet     Status: None   Collection Time: 02/12/20  8:03 AM  Result Value Ref Range   WBC 6.9 4.5 - 13.5 K/uL   RBC 4.53 3.80 - 5.70 Mil/uL   Hemoglobin 13.1 12.0 - 16.0 g/dL   HCT 39.4 36.0 - 49.0 %   MCV 87.0 78.0 - 98.0 fl   MCHC 33.3 31.0 - 37.0 g/dL   RDW 12.5 11.4 - 15.5 %    Platelets 287.0 150.0 - 575.0 K/uL   Neutrophils Relative % 53.0 43.0 - 71.0 %   Lymphocytes Relative 35.8 24.0 - 48.0 %   Monocytes Relative 7.3 3.0 - 12.0 %   Eosinophils Relative 3.4 0.0 - 5.0 %   Basophils Relative 0.5 0.0 - 3.0 %   Neutro Abs 3.6 1.4 - 7.7 K/uL   Lymphs Abs 2.5 0.7 - 4.0 K/uL   Monocytes Absolute 0.5 0.1 - 1.0 K/uL   Eosinophils Absolute 0.2 0.0 - 0.7 K/uL   Basophils Absolute 0.0 0.0 - 0.1 K/uL  Comprehensive metabolic panel     Status: None   Collection Time: 02/12/20  8:03 AM  Result Value Ref Range   Sodium 136 135 - 145 mEq/L   Potassium 3.5 3.5 - 5.1 mEq/L   Chloride 104 96 - 112 mEq/L   CO2 24 19 - 32 mEq/L   Glucose,  Bld 96 70 - 99 mg/dL   BUN 11 6 - 23 mg/dL   Creatinine, Ser 0.72 0.40 - 1.20 mg/dL   Total Bilirubin 0.2 0.2 - 1.2 mg/dL   Alkaline Phosphatase 50 47 - 119 U/L   AST 14 0 - 37 U/L   ALT 14 0 - 35 U/L   Total Protein 6.6 6.0 - 8.3 g/dL   Albumin 4.0 3.5 - 5.2 g/dL   GFR 121.32 >60.00 mL/min    Comment: Calculated using the CKD-EPI Creatinine Equation (2021)   Calcium 8.8 8.4 - 10.5 mg/dL   Objective  Body mass index is 24.23 kg/m. Wt Readings from Last 3 Encounters:  02/26/20 136 lb 12.8 oz (62.1 kg) (66 %, Z= 0.42)*  02/23/20 138 lb (62.6 kg) (68 %, Z= 0.47)*  02/11/20 135 lb 6.4 oz (61.4 kg) (64 %, Z= 0.37)*   * Growth percentiles are based on CDC (Girls, 2-20 Years) data.   Temp Readings from Last 3 Encounters:  02/26/20 98.9 F (37.2 C) (Oral)  02/11/20 98.6 F (37 C) (Oral)  10/17/19 98.7 F (37.1 C)   BP Readings from Last 3 Encounters:  02/26/20 100/68  02/23/20 112/74  02/11/20 106/74   Pulse Readings from Last 3 Encounters:  02/26/20 89  02/23/20 82  02/11/20 82    Physical Exam Vitals and nursing note reviewed.  Constitutional:      Appearance: Normal appearance. She is well-developed and well-groomed.  HENT:     Head: Normocephalic and atraumatic.  Eyes:     Conjunctiva/sclera: Conjunctivae  normal.     Pupils: Pupils are equal, round, and reactive to light.  Cardiovascular:     Rate and Rhythm: Normal rate and regular rhythm.     Heart sounds: Normal heart sounds. No murmur heard.   Pulmonary:     Effort: Pulmonary effort is normal.     Breath sounds: Normal breath sounds.  Skin:    General: Skin is warm and dry.  Neurological:     General: No focal deficit present.     Mental Status: She is alert and oriented to person, place, and time. Mental status is at baseline.     Gait: Gait normal.  Psychiatric:        Attention and Perception: Attention and perception normal.        Mood and Affect: Mood and affect normal.        Speech: Speech normal.        Behavior: Behavior normal. Behavior is cooperative.        Thought Content: Thought content normal.        Cognition and Memory: Cognition and memory normal.        Judgment: Judgment normal.     Assessment  Plan  Hematuria, unspecified type - Plan: Urine Culture  B12 deficiency - Plan: Vitamin B12 rec take 1000 mcg qd   Resolved right nipple galactorrhea thought 2/2 meds  [place] negative etiology   Hm  Fasting labsutd  Flu shot givenutd tdap had 01/16/11 covid 2/2pfizer consider booster declines for now  F/u ob/gyn Dr. Gala Romney heavy mensesas of 08/06/19 still having on provera  Referred dermatology prev left forehead lesion, acne skin cancer checkgiven treitinoin  rec healthy diet and exercise  Consider hep C/HIV testing in future  specialists Ob/gyn Dr. Gala Romney Eye Dr. Annamaria Boots  Dentist Dr. Georgie Chard  Prior PCP Dr. Bertell Maria [place]  Provider: Dr. Olivia Mackie [facility]

## 2020-02-26 NOTE — Patient Instructions (Addendum)
B12 1000 mcg daily 55 to 64 ounces water daily    Vitamin B12 Deficiency Vitamin B12 deficiency occurs when the body does not have enough vitamin B12, which is an important vitamin. The body needs this vitamin:  To make red blood cells.  To make DNA. This is the genetic material inside cells.  To help the nerves work properly so they can carry messages from the brain to the body. Vitamin B12 deficiency can cause various health problems, such as a low red blood cell count (anemia) or nerve damage. What are the causes? This condition may be caused by:  Not eating enough foods that contain vitamin B12.  Not having enough stomach acid and digestive fluids to properly absorb vitamin B12 from the food that you eat.  Certain digestive system diseases that make it hard to absorb vitamin B12. These diseases include Crohn's disease, chronic pancreatitis, and cystic fibrosis.  A condition in which the body does not make enough of a protein (intrinsic factor), resulting in too few red blood cells (pernicious anemia).  Having a surgery in which part of the stomach or small intestine is removed.  Taking certain medicines that make it hard for the body to absorb vitamin B12. These medicines include: ? Heartburn medicines (antacids and proton pump inhibitors). ? Certain antibiotic medicines. ? Some medicines that are used to treat diabetes, tuberculosis, gout, or high cholesterol. What increases the risk? The following factors may make you more likely to develop a B12 deficiency:  Being older than age 35.  Eating a vegetarian or vegan diet, especially while you are pregnant.  Eating a poor diet while you are pregnant.  Taking certain medicines.  Having alcoholism. What are the signs or symptoms? In some cases, there are no symptoms of this condition. If the condition leads to anemia or nerve damage, various symptoms can occur, such as:  Weakness.  Fatigue.  Loss of  appetite.  Weight loss.  Numbness or tingling in your hands and feet.  Redness and burning of the tongue.  Confusion or memory problems.  Depression.  Sensory problems, such as color blindness, ringing in the ears, or loss of taste.  Diarrhea or constipation.  Trouble walking. If anemia is severe, symptoms can include:  Shortness of breath.  Dizziness.  Rapid heart rate (tachycardia). How is this diagnosed? This condition may be diagnosed with a blood test to measure the level of vitamin B12 in your blood. You may also have other tests, including:  A group of tests that measure certain characteristics of blood cells (complete blood count, CBC).  A blood test to measure intrinsic factor.  A procedure where a thin tube with a camera on the end is used to look into your stomach or intestines (endoscopy). Other tests may be needed to discover the cause of B12 deficiency. How is this treated? Treatment for this condition depends on the cause. This condition may be treated by:  Changing your eating and drinking habits, such as: ? Eating more foods that contain vitamin B12. ? Drinking less alcohol or no alcohol.  Getting vitamin B12 injections.  Taking vitamin B12 supplements. Your health care provider will tell you which dosage is best for you. Follow these instructions at home: Eating and drinking   Eat lots of healthy foods that contain vitamin B12, including: ? Meats and poultry. This includes beef, pork, chicken, [place], and organ meats, such as liver. ? Seafood. This includes clams, rainbow trout, salmon, tuna, and haddock. ? Eggs. ?  Cereal and dairy products that are fortified. This means that vitamin B12 has been added to the food. Check the label on the package to see if the food is fortified. The items listed above may not be a complete list of recommended foods and beverages. Contact a dietitian for more information. General instructions  Get any injections  that are prescribed by your health care provider.  Take supplements only as told by your health care provider. Follow the directions carefully.  Do not drink alcohol if your health care provider tells you not to. In some cases, you may only be asked to limit alcohol use.  Keep all follow-up visits as told by your health care provider. This is important. Contact a health care provider if:  Your symptoms come back. Get help right away if you:  Develop shortness of breath.  Have a rapid heart rate.  Have chest pain.  Become dizzy or lose consciousness. Summary  Vitamin B12 deficiency occurs when the body does not have enough vitamin B12.  The main causes of vitamin B12 deficiency include dietary deficiency, digestive diseases, pernicious anemia, and having a surgery in which part of the stomach or small intestine is removed.  In some cases, there are no symptoms of this condition. If the condition leads to anemia or nerve damage, various symptoms can occur, such as weakness, shortness of breath, and numbness.  Treatment may include getting vitamin B12 injections or taking vitamin B12 supplements. Eat lots of healthy foods that contain vitamin B12. This information is not intended to replace advice given to you by your health care provider. Make sure you discuss any questions you have with your health care provider. Document Revised: 07/26/2018 Document Reviewed: 10/16/2017 Elsevier Patient Education  2020 [employer].

## 2020-03-01 ENCOUNTER — Telehealth: Payer: Self-pay | Admitting: Internal Medicine

## 2020-03-01 LAB — URINE CULTURE

## 2020-03-01 NOTE — Telephone Encounter (Signed)
LMTCB

## 2020-03-01 NOTE — Progress Notes (Signed)
Spoke with the [facility] representative he stated the results should be out later today.

## 2020-03-01 NOTE — Telephone Encounter (Signed)
Patient called in for labs results

## 2020-03-02 ENCOUNTER — Other Ambulatory Visit: Payer: Self-pay | Admitting: Internal Medicine

## 2020-03-02 ENCOUNTER — Encounter: Payer: Self-pay | Admitting: Internal Medicine

## 2020-03-02 DIAGNOSIS — N3000 Acute cystitis without hematuria: Secondary | ICD-10-CM

## 2020-03-02 MED ORDER — CIPROFLOXACIN HCL 500 MG PO TABS
500.0000 mg | ORAL_TABLET | Freq: Two times a day (BID) | ORAL | 0 refills | Status: DC
Start: 2020-03-02 — End: 2020-03-02

## 2020-03-02 MED ORDER — PHENAZOPYRIDINE HCL 200 MG PO TABS: 200.0000 mg | ORAL_TABLET | Freq: Three times a day (TID) | ORAL | 0 refills | Status: DC | PRN

## 2020-03-02 NOTE — Telephone Encounter (Signed)
Pt called to get lab results °

## 2020-03-02 NOTE — Telephone Encounter (Signed)
Patient's mother called for daughter's labs. Patient's twin sister was called, patient's phone is [phone].

## 2020-03-02 NOTE — Telephone Encounter (Signed)
Patient was informed of results per result note. Stated she was having discomfort. Please advise  on sending abx for urine culture results

## 2020-03-02 NOTE — Telephone Encounter (Signed)
LMTCB

## 2020-03-04 ENCOUNTER — Telehealth: Payer: Self-pay

## 2020-03-04 NOTE — Telephone Encounter (Signed)
Left message to call back for lab results.

## 2020-03-04 NOTE — Telephone Encounter (Signed)
Pt's mother called in and wanted to speak with office manager because she said that her daughter's twin daughter is being called for Andrea Stevens. She doesn't understand how Andrea Stevens is being called. I verified the number with Andrea Stevens and transferred her over to Andrea Stevens to get Andrea Stevens lab results. She said that if this was the hospital where she work we could be in big trouble for leaving messages on Andrea Stevens phone for Andrea Stevens. I also spoke with Andrea Stevens in which his lab results phone number is showing for Andrea Stevens which we don't understand.

## 2020-04-15 ENCOUNTER — Telehealth: Payer: BC Managed Care – PPO | Admitting: Obstetrics & Gynecology

## 2020-04-15 ENCOUNTER — Telehealth: Payer: Self-pay | Admitting: Radiology

## 2020-04-15 NOTE — Telephone Encounter (Signed)
Patient called and cancelled appointment for virtual visit with Dr Harolyn Rutherford 04/12/20 @ 10:45. Stated that she she felt better and wanted to cancel. Appointment cancelled and Dr Harolyn Rutherford notified.

## 2020-05-25 ENCOUNTER — Other Ambulatory Visit: Payer: Self-pay

## 2020-05-28 ENCOUNTER — Other Ambulatory Visit: Payer: Self-pay

## 2020-05-28 MED FILL — Norethindrone & Ethinyl Estradiol Tab 1 MG-35 MCG: ORAL | 28 days supply | Qty: 28 | Fill #0 | Status: AC

## 2020-06-10 MED FILL — Fluoxetine HCl Cap 20 MG: ORAL | 30 days supply | Qty: 30 | Fill #0 | Status: AC

## 2020-06-10 MED FILL — Bupropion HCl Tab ER 24HR 150 MG: ORAL | 30 days supply | Qty: 30 | Fill #0 | Status: AC

## 2020-06-11 ENCOUNTER — Other Ambulatory Visit: Payer: Self-pay

## 2020-06-23 ENCOUNTER — Other Ambulatory Visit: Payer: Self-pay

## 2020-06-23 MED FILL — Norethindrone & Ethinyl Estradiol Tab 1 MG-35 MCG: ORAL | 28 days supply | Qty: 28 | Fill #1 | Status: AC

## 2020-07-13 ENCOUNTER — Other Ambulatory Visit: Payer: Self-pay

## 2020-07-13 ENCOUNTER — Telehealth (INDEPENDENT_AMBULATORY_CARE_PROVIDER_SITE_OTHER): Payer: BC Managed Care – PPO | Admitting: Gastroenterology

## 2020-07-13 DIAGNOSIS — K5900 Constipation, unspecified: Secondary | ICD-10-CM | POA: Diagnosis not present

## 2020-07-13 NOTE — Progress Notes (Signed)
Andrea Stevens  [address]  [place]  [place], [place] [address]  Main: [phone]  Fax: [phone]   Gastroenterology Consultation  Referring Provider:     McLean-Scocuzza, Olivia Mackie * Primary Care Physician:  McLean-Scocuzza, Nino Glow, MD Reason for Consultation:     Constipation         HPI:   Virtual Visit via video  Note  I connected with patient on 07/13/20 at  1:00 PM EDT by video  and verified that I am speaking with the correct person using two identifiers.   I discussed the limitations, risks, security and privacy concerns of performing an evaluation and management service by video and the availability of in person appointments. I also discussed with the patient that there may be a patient responsible charge related to this service. The patient expressed understanding and agreed to proceed.  Location of the patient: Home Location of provider: Home Participating persons: Patient and provider only   History of Present Illness:  C/c Constipation    Andrea Stevens is a 20 y.o. y/o female referred for consultation & management  by Dr. Terese Door, Nino Glow, MD.     She states that he has had issues with constipation for a few weeks.  Started all of a sudden.  Previously to have a bowel movement 3 times a week and now has been once a week.  Denies any change in shape of stool, no blood in the stool, no unintentional weight loss.  Mother suffers from hypothyroidism, she has had some heat intolerance, dry skin.  Not had her TSH checked recently.  She tried MiraLAX which has not helped.  No family history of colon cancer.  Past Medical History:  Diagnosis Date  . Allergy   . Anorexia    age 49 yo as of 20 y.o not active   . Chronic headaches   . Depression, major, single episode   . Eating disorder 2015   resolved.  . Migraines   . PONV (postoperative nausea and vomiting)     Past Surgical History:  Procedure Laterality Date  . FOOT SURGERY     tailors  bunion 2019  . GANGLION CYST EXCISION Left 07/30/2018   Procedure: REMOVAL GANGLION OF LEFT DORSAL WRIST;  Surgeon: Leanora Cover, MD;  Location: [facility];  Service: Orthopedics;  Laterality: Left;  bier block  . TONSILLECTOMY     2008  . TONSILLECTOMY AND ADENOIDECTOMY    . TYMPANOSTOMY TUBE PLACEMENT    . WISDOM TOOTH EXTRACTION      Prior to Admission medications   Medication Sig Start Date End Date Taking? Authorizing Provider  buPROPion (WELLBUTRIN XL) 150 MG 24 hr tablet TAKE 1 TABLET BY MOUTH DAILY. 02/11/20 02/10/21  McLean-Scocuzza, Nino Glow, MD  cholecalciferol (VITAMIN D3) 25 MCG (1000 UNIT) tablet Take 1,000 Units by mouth daily.    [provider]  ciprofloxacin (CIPRO) 500 MG tablet TAKE 1 TABLET BY MOUTH 2 TIMES DAILY. TAKE WITH FOOD. 03/02/20 03/02/21  McLean-Scocuzza, Nino Glow, MD  Ferrous Sulfate (IRON PO) Take by mouth.    [provider]  FLUoxetine (PROZAC) 20 MG capsule TAKE 1 CAPSULE BY MOUTH DAILY. 02/11/20 02/10/21  McLean-Scocuzza, Nino Glow, MD  fluticasone (FLONASE) 50 MCG/ACT nasal spray Place into both nostrils daily.    [provider]  Levonorgestrel ([name]) 19.5 MG IUD by Intrauterine route.    [provider]  norethindrone-ethinyl estradiol 1/35 (ORTHO-NOVUM) tablet TAKE 1 TABLET BY MOUTH  DAILY 01/13/20 01/12/21  Anyanwu, Sallyanne Havers, MD  phenazopyridine (PYRIDIUM) 200 MG tablet TAKE 1 TABLET BY MOUTH 3 TIMES DAILY AS NEEDED FOR PAIN. 03/02/20 03/02/21  McLean-Scocuzza, Nino Glow, MD  tretinoin (RETIN-A) 0.025 % cream  01/23/19   [provider]  vitamin B-12 (CYANOCOBALAMIN) 500 MCG tablet Take 500 mcg by mouth daily.    [provider]  Norethindrone Acetate-Ethinyl Estrad-FE (LOESTRIN 24 FE) 1-20 MG-MCG(24) tablet Take 1 tablet by mouth daily. Take hormonal tablets only in continuous fashion 10/23/19 01/13/20  Anyanwu, Sallyanne Havers, MD    Family History  Problem Relation Age of Onset  . Diabetes Mother         type 1  . Heart disease Mother   . Arthritis Mother   . Learning disabilities Mother   . Miscarriages / [place] Mother   . Allergies Father   . Kidney disease Father   . Hypertension Maternal Grandmother   . Anxiety disorder Maternal Grandmother   . Hyperlipidemia Maternal Grandmother   . Mental illness Maternal Grandmother   . Hypertension Maternal Grandfather   . Arthritis Maternal Grandfather   . Drug abuse Maternal Grandfather   . Hearing loss Maternal Grandfather   . Heart disease Maternal Grandfather   . Hyperlipidemia Maternal Grandfather   . Heart disease Paternal Grandmother   . Arthritis Paternal Grandmother   . Hypertension Paternal Grandmother   . Cancer Paternal Grandfather        skin melanoma     Social History   Tobacco Use  . Smoking status: Never Smoker  . Smokeless tobacco: Never Used  Vaping Use  . Vaping Use: Never used  Substance Use Topics  . Alcohol use: No  . Drug use: No    Allergies as of 07/13/2020 - Review Complete 02/26/2020  Allergen Reaction Noted  . Fentanyl  12/10/2018  . Versed [midazolam]  12/10/2018    Review of Systems:    All systems reviewed and negative except where noted in HPI. General Appearance:    Alert, cooperative, no distress, appears stated age  Head:    Normocephalic, without obvious abnormality, atraumatic  Eyes:    PERRL, conjunctiva/corneas clear,  Ears:    Grossly normal hearing    Neurologic:   Grossly appears normal     Observations/Objective:  Labs: CBC    Component Value Date/Time   WBC 6.9 02/12/2020 0803   RBC 4.53 02/12/2020 0803   HGB 13.1 02/12/2020 0803   HGB 13.6 01/13/2020 1506   HCT 39.4 02/12/2020 0803   HCT 40.1 01/13/2020 1506   PLT 287.0 02/12/2020 0803   PLT 331 01/13/2020 1506   MCV 87.0 02/12/2020 0803   MCV 87 01/13/2020 1506   MCH 29.6 01/13/2020 1506   MCH 29.2 03/31/2019 0932   MCHC 33.3 02/12/2020 0803   RDW 12.5 02/12/2020 0803   RDW 11.7 01/13/2020 1506    LYMPHSABS 2.5 02/12/2020 0803   MONOABS 0.5 02/12/2020 0803   EOSABS 0.2 02/12/2020 0803   BASOSABS 0.0 02/12/2020 0803   CMP     Component Value Date/Time   NA 136 02/12/2020 0803   NA 139 01/13/2020 1508   K 3.5 02/12/2020 0803   CL 104 02/12/2020 0803   CO2 24 02/12/2020 0803   GLUCOSE 96 02/12/2020 0803   BUN 11 02/12/2020 0803   BUN 11 01/13/2020 1508   CREATININE 0.72 02/12/2020 0803   CALCIUM 8.8 02/12/2020 0803   PROT 6.6 02/12/2020 0803   PROT 7.1 01/13/2020  1508   ALBUMIN 4.0 02/12/2020 0803   ALBUMIN 4.4 01/13/2020 1508   AST 14 02/12/2020 0803   ALT 14 02/12/2020 0803   ALKPHOS 50 02/12/2020 0803   BILITOT 0.2 02/12/2020 0803   BILITOT 0.3 01/13/2020 1508   GFRNONAA 100 01/13/2020 1508   GFRAA 115 01/13/2020 1508    Imaging Studies: No results found.  Assessment and Plan:   LAVORIS CANIZALES is a 20 y.o. y/o female has been referred for constipation ongoing for a few weeks.  Failed MiraLAX.  She does feel cold all the time and has had some dry skin.  TSH was normal in December but with a family history of hypothyroidism in her mother particularly I will recheck for hypothyroidism.  Suggest a high-fiber diet, provided [facility] high-fiber diet to try, since she has failed MiraLAX we will commence her on Linzess 145 mcg daily tablets.  I have asked her to come and collect sample from my office.  I will speak to her in 3 to 4 weeks after she has had her lab work and try to be high-fiber diet with Linzess.  If she is doing well we will take her off the Linzess and see if the high-fiber diet can help with the constipation.  If concerns of constipation persist despite conservative management then may need to consider colonoscopy due to the acute change in bowel habits although my suspicion is low for any underlying cause.   Video visit in 4 weeks  I discussed the assessment and treatment plan with the patient. The patient was provided an opportunity to ask questions  and all were answered. The patient agreed with the plan and demonstrated an understanding of the instructions.   The patient was advised to call back or seek an in-person evaluation if the symptoms worsen or if the condition fails to improve as anticipated.  I provided 20 minutes of face-to-face time during this encounter.   Dr Andrea Bellows MD,MRCP [facility]) Gastroenterology/Hepatology Pager: [phone]   Speech recognition software was used to dictate the above note.

## 2020-07-13 NOTE — Progress Notes (Signed)
Patient will be given samples of Linzess 145 mcg (4) to take. Mother plans to pick them up this afternoon.

## 2020-07-15 MED FILL — Bupropion HCl Tab ER 24HR 150 MG: ORAL | 30 days supply | Qty: 30 | Fill #1 | Status: AC

## 2020-07-15 MED FILL — Fluoxetine HCl Cap 20 MG: ORAL | 30 days supply | Qty: 30 | Fill #1 | Status: AC

## 2020-07-16 ENCOUNTER — Other Ambulatory Visit: Payer: Self-pay

## 2020-07-21 LAB — TSH: TSH: 0.614 u[IU]/mL (ref 0.450–4.500)

## 2020-07-23 ENCOUNTER — Other Ambulatory Visit: Payer: Self-pay

## 2020-07-23 MED FILL — Norethindrone & Ethinyl Estradiol Tab 1 MG-35 MCG: ORAL | 28 days supply | Qty: 28 | Fill #2 | Status: AC

## 2020-07-26 ENCOUNTER — Other Ambulatory Visit: Payer: Self-pay

## 2020-07-26 ENCOUNTER — Ambulatory Visit
Admission: RE | Admit: 2020-07-26 | Discharge: 2020-07-26 | Disposition: A | Discharge status: Home / Self Care | Payer: BC Managed Care – PPO | Source: Ambulatory Visit | Attending: Internal Medicine | Admitting: Internal Medicine

## 2020-07-26 VITALS — BP 115/72 | HR 76 | Temp 98.4°F | Resp 18 | Ht 63.0 in | Wt 136.9 lb

## 2020-07-26 DIAGNOSIS — L03011 Cellulitis of right finger: Secondary | ICD-10-CM | POA: Diagnosis not present

## 2020-07-26 NOTE — ED Provider Notes (Signed)
[facility]    CSN: [id number] Arrival date & time: 07/26/20  1353      History   Chief Complaint Chief Complaint  Patient presents with  . Hand Pain    HPI Andrea Stevens is a 20 y.o. female who presents due to having redness and some pain of x 3 days. Is feeling better, but saw a slight purple discoloration so wants it checked. She thought she had a hand nail and pulled it and puss came out. Since them has been cleaning it with peroxide and applying antibiotic ointment.    Past Medical History:  Diagnosis Date  . Allergy   . Anorexia    age 22 yo as of 20 y.o not active   . Chronic headaches   . Depression, major, single episode   . Eating disorder 2015   resolved.  . Migraines   . PONV (postoperative nausea and vomiting)     Patient Active Problem List   Diagnosis Date Noted  . B12 deficiency 02/26/2020  . Depression, recurrent ([place]) 08/06/2019  . Anxiety 08/06/2019  . DUB (dysfunctional uterine bleeding) 08/06/2019  . Fatigue 12/10/2018  . Annual physical exam 12/10/2018  . Iron deficiency 12/10/2018  . Menorrhagia with regular cycle 12/10/2018  . Acne 12/10/2018  . Vitamin D deficiency 12/10/2018  . Tailor's bunion of left foot 04/17/2018  . Anxiety and depression 05/04/2017  . Ganglion cyst of dorsum of left wrist 02/26/2017  . Non-suicidal self harm 04/09/2015  . Single current episode of major depressive disorder 04/09/2015  . Acute pain of left wrist 02/11/2015  . Injury of left elbow 02/11/2015  . Eating disorder 08/27/2013  . ALLERGIC RHINITIS CAUSE UNSPECIFIED 04/18/2007  . Constipation 04/18/2007  . HEADACHE 04/18/2007    Past Surgical History:  Procedure Laterality Date  . FOOT SURGERY     tailors bunion 2019  . GANGLION CYST EXCISION Left 07/30/2018   Procedure: REMOVAL GANGLION OF LEFT DORSAL WRIST;  Surgeon: Leanora Cover, MD;  Location: [facility];  Service: Orthopedics;  Laterality: Left;  bier block  .  TONSILLECTOMY     2008  . TONSILLECTOMY AND ADENOIDECTOMY    . TYMPANOSTOMY TUBE PLACEMENT    . WISDOM TOOTH EXTRACTION      OB History    Gravida  0   Para  0   Term  0   Preterm  0   AB  0   Living  0     SAB  0   IAB  0   Ectopic  0   Multiple  0   Live Births  0            Home Medications    Prior to Admission medications   Medication Sig Start Date End Date Taking? Authorizing Provider  buPROPion (WELLBUTRIN XL) 150 MG 24 hr tablet TAKE 1 TABLET BY MOUTH DAILY. 02/11/20 02/10/21 Yes McLean-Scocuzza, Nino Glow, MD  cholecalciferol (VITAMIN D3) 25 MCG (1000 UNIT) tablet Take 1,000 Units by mouth daily.   Yes [provider]  Ferrous Sulfate (IRON PO) Take by mouth.   Yes [provider]  FLUoxetine (PROZAC) 20 MG capsule TAKE 1 CAPSULE BY MOUTH DAILY. 02/11/20 02/10/21 Yes McLean-Scocuzza, Nino Glow, MD  fluticasone (FLONASE) 50 MCG/ACT nasal spray Place into both nostrils daily.   Yes [provider]  Levonorgestrel ([name]) 19.5 MG IUD by Intrauterine route.   Yes [provider]  norethindrone-ethinyl estradiol 1/35 (ORTHO-NOVUM) tablet TAKE 1  TABLET BY MOUTH DAILY 01/13/20 01/12/21 Yes Anyanwu, Sallyanne Havers, MD  tretinoin (RETIN-A) 0.025 % cream  01/23/19  Yes [provider]  vitamin B-12 (CYANOCOBALAMIN) 500 MCG tablet Take 500 mcg by mouth daily.   Yes [provider]  ciprofloxacin (CIPRO) 500 MG tablet TAKE 1 TABLET BY MOUTH 2 TIMES DAILY. TAKE WITH FOOD. 03/02/20 03/02/21  McLean-Scocuzza, Nino Glow, MD  phenazopyridine (PYRIDIUM) 200 MG tablet TAKE 1 TABLET BY MOUTH 3 TIMES DAILY AS NEEDED FOR PAIN. 03/02/20 03/02/21  McLean-Scocuzza, Nino Glow, MD  Norethindrone Acetate-Ethinyl Estrad-FE (LOESTRIN 24 FE) 1-20 MG-MCG(24) tablet Take 1 tablet by mouth daily. Take hormonal tablets only in continuous fashion 10/23/19 01/13/20  Anyanwu, Sallyanne Havers, MD    Family History Family History  Problem Relation Age of Onset   . Diabetes Mother        type 1  . Heart disease Mother   . Arthritis Mother   . Learning disabilities Mother   . Miscarriages / [place] Mother   . Allergies Father   . Kidney disease Father   . Hypertension Maternal Grandmother   . Anxiety disorder Maternal Grandmother   . Hyperlipidemia Maternal Grandmother   . Mental illness Maternal Grandmother   . Hypertension Maternal Grandfather   . Arthritis Maternal Grandfather   . Drug abuse Maternal Grandfather   . Hearing loss Maternal Grandfather   . Heart disease Maternal Grandfather   . Hyperlipidemia Maternal Grandfather   . Heart disease Paternal Grandmother   . Arthritis Paternal Grandmother   . Hypertension Paternal Grandmother   . Cancer Paternal Grandfather        skin melanoma    Social History Social History   Tobacco Use  . Smoking status: Never Smoker  . Smokeless tobacco: Never Used  Vaping Use  . Vaping Use: Never used  Substance Use Topics  . Alcohol use: No  . Drug use: No     Allergies   Fentanyl and Versed [midazolam]   Review of Systems Review of Systems  Skin: Positive for color change. Negative for pallor, rash and wound.  Hematological: Negative for adenopathy.     Physical Exam Triage Vital Signs ED Triage Vitals  Enc Vitals Group     BP 07/26/20 1411 115/72     Pulse Rate 07/26/20 1411 76     Resp 07/26/20 1411 18     Temp 07/26/20 1411 98.4 F (36.9 C)     Temp Source 07/26/20 1411 Oral     SpO2 07/26/20 1411 99 %     Weight 07/26/20 1409 136 lb 14.5 oz (62.1 kg)     Height 07/26/20 1409 5\' 3"  (1.6 m)     Head Circumference --      Peak Flow --      Pain Score 07/26/20 1409 0     Pain Loc --      Pain Edu? --      Excl. in [place]? --    No data found.  Updated Vital Signs BP 115/72 (BP Location: Left Arm)   Pulse 76   Temp 98.4 F (36.9 C) (Oral)   Resp 18   Ht 5\' 3"  (1.6 m)   Wt 136 lb 14.5 oz (62.1 kg)   SpO2 99%   BMI 24.25 kg/m   Visual Acuity Right Eye  Distance:   Left Eye Distance:   Bilateral Distance:    Right Eye Near:   Left Eye Near:    Bilateral Near:  Physical Exam Vitals and nursing note reviewed.  Constitutional:      General: She is not in acute distress.    Appearance: She is normal weight. She is not toxic-appearing.  HENT:     Head: Normocephalic.     Right Ear: External ear normal.     Left Ear: External ear normal.  Eyes:     General: No scleral icterus.    Conjunctiva/sclera: Conjunctivae normal.  Pulmonary:     Effort: Pulmonary effort is normal.  Musculoskeletal:        General: Normal range of motion.     Cervical back: Neck supple.  Skin:    General: Skin is warm and dry.     Findings: No rash.     Comments: R middle finger with slight redness on the lateral finger, anterior finger distally looks normal and is not hot.   Neurological:     Mental Status: She is alert and oriented to person, place, and time.     Gait: Gait normal.  Psychiatric:        Mood and Affect: Mood normal.        Behavior: Behavior normal.        Thought Content: Thought content normal.        Judgment: Judgment normal.    UC Treatments / Results  Labs (all labs ordered are listed, but only abnormal results are displayed) Labs Reviewed - No data to display  EKG   Radiology No results found.  Procedures Procedures (including critical care time)  Medications Ordered in UC Medications - No data to display  Initial Impression / Assessment and Plan / UC Course  I have reviewed the triage vital signs and the nursing notes. Has healing peronichia. See instructions  Final Clinical Impressions(s) / UC Diagnoses   Final diagnoses:  Paronychia of right middle finger     Discharge Instructions     Stop the peroxide and soak your finger in Epson salts for 15 minutes a couple times a day and continue the antibiotic ointment for 5 more days.     ED Prescriptions    None     PDMP not reviewed this  encounter.   Shelby Mattocks, PA-C 07/26/20 1432

## 2020-07-26 NOTE — ED Triage Notes (Signed)
Patient states her right middle finger finger nail is infected. She states this started on Friday. She does report improvement in pain and swelling but wanted her finger looked at.

## 2020-07-26 NOTE — Discharge Instructions (Signed)
Stop the peroxide and soak your finger in Epson salts for 15 minutes a couple times a day and continue the antibiotic ointment for 5 more days.

## 2020-08-12 ENCOUNTER — Other Ambulatory Visit: Payer: Self-pay

## 2020-08-12 ENCOUNTER — Telehealth (INDEPENDENT_AMBULATORY_CARE_PROVIDER_SITE_OTHER): Payer: BC Managed Care – PPO | Admitting: Gastroenterology

## 2020-08-12 DIAGNOSIS — Z91199 Patient's noncompliance with other medical treatment and regimen due to unspecified reason: Secondary | ICD-10-CM

## 2020-08-12 DIAGNOSIS — Z5329 Procedure and treatment not carried out because of patient's decision for other reasons: Secondary | ICD-10-CM

## 2020-08-16 NOTE — Progress Notes (Signed)
No show

## 2020-08-20 ENCOUNTER — Other Ambulatory Visit: Payer: Self-pay

## 2020-08-20 MED FILL — Fluoxetine HCl Cap 20 MG: ORAL | 30 days supply | Qty: 30 | Fill #2 | Status: AC

## 2020-08-20 MED FILL — Bupropion HCl Tab ER 24HR 150 MG: ORAL | 30 days supply | Qty: 30 | Fill #2 | Status: AC

## 2020-08-20 MED FILL — Norethindrone & Ethinyl Estradiol Tab 1 MG-35 MCG: ORAL | 28 days supply | Qty: 28 | Fill #3 | Status: AC

## 2020-08-25 ENCOUNTER — Ambulatory Visit: Payer: BC Managed Care – PPO

## 2020-09-11 MED FILL — Fluoxetine HCl Cap 20 MG: ORAL | 30 days supply | Qty: 30 | Fill #3 | Status: AC

## 2020-09-11 MED FILL — Bupropion HCl Tab ER 24HR 150 MG: ORAL | 30 days supply | Qty: 30 | Fill #3 | Status: AC

## 2020-09-11 MED FILL — Norethindrone & Ethinyl Estradiol Tab 1 MG-35 MCG: ORAL | 28 days supply | Qty: 28 | Fill #4 | Status: AC

## 2020-09-13 ENCOUNTER — Other Ambulatory Visit: Payer: Self-pay

## 2020-10-14 ENCOUNTER — Other Ambulatory Visit: Payer: Self-pay

## 2020-10-14 MED FILL — Fluoxetine HCl Cap 20 MG: ORAL | 30 days supply | Qty: 30 | Fill #4 | Status: AC

## 2020-10-14 MED FILL — Norethindrone & Ethinyl Estradiol Tab 1 MG-35 MCG: ORAL | 28 days supply | Qty: 28 | Fill #5 | Status: AC

## 2020-10-14 MED FILL — Bupropion HCl Tab ER 24HR 150 MG: ORAL | 30 days supply | Qty: 30 | Fill #4 | Status: AC

## 2020-11-15 ENCOUNTER — Other Ambulatory Visit: Payer: Self-pay

## 2020-11-15 MED FILL — Norethindrone & Ethinyl Estradiol Tab 1 MG-35 MCG: ORAL | 28 days supply | Qty: 28 | Fill #6 | Status: AC

## 2020-11-15 MED FILL — Bupropion HCl Tab ER 24HR 150 MG: ORAL | 30 days supply | Qty: 30 | Fill #5 | Status: CN

## 2020-11-15 MED FILL — Fluoxetine HCl Cap 20 MG: ORAL | 30 days supply | Qty: 30 | Fill #5 | Status: CN

## 2020-11-15 MED FILL — Norethindrone & Ethinyl Estradiol Tab 1 MG-35 MCG: ORAL | 28 days supply | Qty: 28 | Fill #6 | Status: CN

## 2020-11-15 MED FILL — Fluoxetine HCl Cap 20 MG: ORAL | 30 days supply | Qty: 30 | Fill #5 | Status: AC

## 2020-11-15 MED FILL — Bupropion HCl Tab ER 24HR 150 MG: ORAL | 30 days supply | Qty: 30 | Fill #5 | Status: AC

## 2020-12-09 ENCOUNTER — Other Ambulatory Visit: Payer: Self-pay

## 2020-12-09 ENCOUNTER — Other Ambulatory Visit: Payer: Self-pay | Admitting: Obstetrics & Gynecology

## 2020-12-09 DIAGNOSIS — N921 Excessive and frequent menstruation with irregular cycle: Secondary | ICD-10-CM

## 2020-12-09 DIAGNOSIS — N939 Abnormal uterine and vaginal bleeding, unspecified: Secondary | ICD-10-CM

## 2020-12-09 DIAGNOSIS — Z975 Presence of (intrauterine) contraceptive device: Secondary | ICD-10-CM

## 2020-12-09 MED FILL — Fluoxetine HCl Cap 20 MG: ORAL | 30 days supply | Qty: 30 | Fill #6 | Status: AC

## 2020-12-09 MED FILL — Bupropion HCl Tab ER 24HR 150 MG: ORAL | 30 days supply | Qty: 30 | Fill #6 | Status: AC

## 2020-12-10 ENCOUNTER — Other Ambulatory Visit: Payer: Self-pay

## 2020-12-10 MED FILL — Norethindrone & Ethinyl Estradiol Tab 1 MG-35 MCG: ORAL | 28 days supply | Qty: 28 | Fill #0 | Status: AC

## 2021-01-03 ENCOUNTER — Other Ambulatory Visit: Payer: Self-pay

## 2021-01-03 MED FILL — Bupropion HCl Tab ER 24HR 150 MG: ORAL | 30 days supply | Qty: 30 | Fill #7 | Status: AC

## 2021-01-03 MED FILL — Norethindrone & Ethinyl Estradiol Tab 1 MG-35 MCG: ORAL | 28 days supply | Qty: 28 | Fill #1 | Status: AC

## 2021-01-03 MED FILL — Fluoxetine HCl Cap 20 MG: ORAL | 30 days supply | Qty: 30 | Fill #7 | Status: AC

## 2021-02-07 ENCOUNTER — Other Ambulatory Visit: Payer: Self-pay

## 2021-02-07 MED FILL — Bupropion HCl Tab ER 24HR 150 MG: ORAL | 30 days supply | Qty: 30 | Fill #8 | Status: AC

## 2021-02-07 MED FILL — Fluoxetine HCl Cap 20 MG: ORAL | 30 days supply | Qty: 30 | Fill #8 | Status: AC

## 2021-02-07 MED FILL — Norethindrone & Ethinyl Estradiol Tab 1 MG-35 MCG: ORAL | 28 days supply | Qty: 28 | Fill #2 | Status: AC

## 2021-02-09 ENCOUNTER — Encounter: Payer: Self-pay | Admitting: Internal Medicine

## 2021-02-11 ENCOUNTER — Encounter: Payer: BC Managed Care – PPO | Admitting: Internal Medicine

## 2021-02-16 ENCOUNTER — Other Ambulatory Visit: Payer: Self-pay

## 2021-02-16 ENCOUNTER — Ambulatory Visit (INDEPENDENT_AMBULATORY_CARE_PROVIDER_SITE_OTHER): Payer: BC Managed Care – PPO | Admitting: Internal Medicine

## 2021-02-16 ENCOUNTER — Encounter: Payer: Self-pay | Admitting: Internal Medicine

## 2021-02-16 VITALS — BP 110/68 | HR 82 | Temp 97.2°F | Ht 62.91 in | Wt 148.2 lb

## 2021-02-16 DIAGNOSIS — N3000 Acute cystitis without hematuria: Secondary | ICD-10-CM | POA: Diagnosis not present

## 2021-02-16 DIAGNOSIS — F419 Anxiety disorder, unspecified: Secondary | ICD-10-CM

## 2021-02-16 DIAGNOSIS — Z111 Encounter for screening for respiratory tuberculosis: Secondary | ICD-10-CM

## 2021-02-16 DIAGNOSIS — Z1329 Encounter for screening for other suspected endocrine disorder: Secondary | ICD-10-CM

## 2021-02-16 DIAGNOSIS — B3731 Acute candidiasis of vulva and vagina: Secondary | ICD-10-CM

## 2021-02-16 DIAGNOSIS — Z23 Encounter for immunization: Secondary | ICD-10-CM | POA: Diagnosis not present

## 2021-02-16 DIAGNOSIS — Z13220 Encounter for screening for lipoid disorders: Secondary | ICD-10-CM

## 2021-02-16 DIAGNOSIS — Z0000 Encounter for general adult medical examination without abnormal findings: Secondary | ICD-10-CM | POA: Diagnosis not present

## 2021-02-16 DIAGNOSIS — Z1389 Encounter for screening for other disorder: Secondary | ICD-10-CM

## 2021-02-16 DIAGNOSIS — E538 Deficiency of other specified B group vitamins: Secondary | ICD-10-CM

## 2021-02-16 DIAGNOSIS — F32A Depression, unspecified: Secondary | ICD-10-CM

## 2021-02-16 DIAGNOSIS — Z0184 Encounter for antibody response examination: Secondary | ICD-10-CM

## 2021-02-16 DIAGNOSIS — E559 Vitamin D deficiency, unspecified: Secondary | ICD-10-CM | POA: Diagnosis not present

## 2021-02-16 MED ORDER — BUPROPION HCL ER (XL) 150 MG PO TB24
ORAL_TABLET | Freq: Every day | ORAL | 3 refills | Status: DC
  Filled 2021-02-16: qty 90, 90d supply, fill #0
  Filled 2021-03-08: qty 30, 30d supply, fill #0
  Filled 2021-04-06: qty 30, 30d supply, fill #1
  Filled 2021-06-02: qty 30, 30d supply, fill #2
  Filled 2021-06-03 – 2021-08-29 (×2): qty 30, 30d supply, fill #3
  Filled 2021-09-30: qty 30, 30d supply, fill #4
  Filled 2021-10-28: qty 30, 30d supply, fill #5

## 2021-02-16 MED ORDER — FLUOXETINE HCL 20 MG PO CAPS
ORAL_CAPSULE | Freq: Every day | ORAL | 3 refills | Status: DC
  Filled 2021-02-16: qty 90, 90d supply, fill #0
  Filled 2021-03-08: qty 30, 30d supply, fill #0
  Filled 2021-04-06: qty 30, 30d supply, fill #1
  Filled 2021-06-02: qty 30, 30d supply, fill #2
  Filled 2021-06-03 – 2021-08-29 (×2): qty 30, 30d supply, fill #3
  Filled 2021-09-30: qty 30, 30d supply, fill #4
  Filled 2021-10-28: qty 30, 30d supply, fill #5

## 2021-02-16 NOTE — Patient Instructions (Signed)
Consider total 4 covid shot

## 2021-02-16 NOTE — Progress Notes (Addendum)
Chief Complaint  Patient presents with   Annual Exam   Annual doing well  Refills of wellbutrin xl 150 mg qd and prozac 20 mg for anxiety/depression  Mood anxiety controlled  Needs abx titers for phlebotomy    Review of Systems  Constitutional:  Negative for weight loss.  HENT:  Negative for hearing loss.   Eyes:  Negative for blurred vision.  Respiratory:  Negative for shortness of breath.   Cardiovascular:  Negative for chest pain.  Gastrointestinal:  Negative for abdominal pain and blood in stool.  Genitourinary:  Positive for frequency. Negative for dysuria.  Musculoskeletal:  Negative for back pain, falls and joint pain.  Skin:  Negative for rash.  Neurological:  Negative for headaches.  Psychiatric/Behavioral:  Negative for depression.   Past Medical History:  Diagnosis Date   Allergy    Anorexia    age 20 yo as of 20 y.o not active    Chronic headaches    Depression, major, single episode    Eating disorder 2015   resolved.   Migraines    PONV (postoperative nausea and vomiting)    Past Surgical History:  Procedure Laterality Date   FOOT SURGERY     tailors bunion 2019   GANGLION CYST EXCISION Left 07/30/2018   Procedure: REMOVAL GANGLION OF LEFT DORSAL WRIST;  Surgeon: Leanora Cover, MD;  Location: [facility];  Service: Orthopedics;  Laterality: Left;  bier block   TONSILLECTOMY     2008   TONSILLECTOMY AND ADENOIDECTOMY     TYMPANOSTOMY TUBE PLACEMENT     WISDOM TOOTH EXTRACTION     Family History  Problem Relation Age of Onset   Diabetes Mother        type 1   Heart disease Mother    Arthritis Mother    Learning disabilities Mother    Miscarriages / [place] Mother    Allergies Father    Kidney disease Father    Hypertension Maternal Grandmother    Anxiety disorder Maternal Grandmother    Hyperlipidemia Maternal Grandmother    Mental illness Maternal Grandmother    Hypertension Maternal Grandfather    Arthritis Maternal Grandfather     Drug abuse Maternal Grandfather    Hearing loss Maternal Grandfather    Heart disease Maternal Grandfather    Hyperlipidemia Maternal Grandfather    Heart disease Paternal Grandmother    Arthritis Paternal Grandmother    Hypertension Paternal Grandmother    Cancer Paternal Grandfather        skin melanoma   Social History   Socioeconomic History   Marital status: Single    Spouse name: Not on file   Number of children: Not on file   Years of education: Not on file   Highest education level: Not on file  Occupational History   Not on file  Tobacco Use   Smoking status: Never   Smokeless tobacco: Never  Vaping Use   Vaping Use: Never used  Substance and Sexual Activity   Alcohol use: No   Drug use: No   Sexual activity: Never    Birth control/protection: I.U.D.  Other Topics Concern   Not on file  Social History Narrative   1 twin sister Lenna Sciara    Lives with sister and mom    Moved from [place]    In [place] interested in medicine pre-med 01/2021 graduated will do phlebotomy and take mcat   Social Determinants of Health   Financial Resource Strain: Not on file  Food Insecurity: Not on file  Transportation Needs: Not on file  Physical Activity: Not on file  Stress: Not on file  Social Connections: Not on file  Intimate Partner Violence: Not on file   Current Meds  Medication Sig   cholecalciferol (VITAMIN D3) 25 MCG (1000 UNIT) tablet Take 1,000 Units by mouth daily.   Ferrous Sulfate (IRON PO) Take by mouth.   fluticasone (FLONASE) 50 MCG/ACT nasal spray Place into both nostrils daily.   Levonorgestrel ([name]) 19.5 MG IUD by Intrauterine route.   norethindrone-ethinyl estradiol 1/35 (ALAYCEN 1/35) tablet TAKE 1 TABLET BY MOUTH DAILY   tretinoin (RETIN-A) 0.025 % cream    vitamin B-12 (CYANOCOBALAMIN) 500 MCG tablet Take 500 mcg by mouth daily.   [DISCONTINUED] buPROPion (WELLBUTRIN XL) 150 MG 24 hr tablet TAKE 1 TABLET BY MOUTH DAILY.   Allergies  Allergen  Reactions   Fentanyl     Nausea    Versed [Midazolam]     nausea   No results found for this or any previous visit (from the past 2160 hour(s)). Objective  Body mass index is 26.33 kg/m. Wt Readings from Last 3 Encounters:  02/16/21 148 lb 3.2 oz (67.2 kg)  07/26/20 136 lb 14.5 oz (62.1 kg) (65 %, Z= 0.39)*  02/26/20 136 lb 12.8 oz (62.1 kg) (66 %, Z= 0.42)*   * Growth percentiles are based on CDC (Girls, 2-20 Years) data.   Temp Readings from Last 3 Encounters:  02/16/21 (!) 97.2 F (36.2 C) (Temporal)  07/26/20 98.4 F (36.9 C) (Oral)  02/26/20 98.9 F (37.2 C) (Oral)   BP Readings from Last 3 Encounters:  02/16/21 110/68  07/26/20 115/72  02/26/20 100/68   Pulse Readings from Last 3 Encounters:  02/16/21 82  07/26/20 76  02/26/20 89    Physical Exam Vitals and nursing note reviewed.  Constitutional:      Appearance: Normal appearance. She is well-developed and well-groomed.  HENT:     Head: Normocephalic and atraumatic.  Eyes:     Conjunctiva/sclera: Conjunctivae normal.     Pupils: Pupils are equal, round, and reactive to light.  Cardiovascular:     Rate and Rhythm: Normal rate and regular rhythm.     Heart sounds: Normal heart sounds. No murmur heard. Pulmonary:     Effort: Pulmonary effort is normal.     Breath sounds: Normal breath sounds.  Abdominal:     General: Abdomen is flat. Bowel sounds are normal.     Tenderness: There is no abdominal tenderness.  Musculoskeletal:        General: No tenderness.  Skin:    General: Skin is warm and dry.  Neurological:     General: No focal deficit present.     Mental Status: She is alert and oriented to person, place, and time. Mental status is at baseline.     Cranial Nerves: Cranial nerves 2-12 are intact.     Gait: Gait is intact.  Psychiatric:        Attention and Perception: Attention and perception normal.        Mood and Affect: Mood and affect normal.        Speech: Speech normal.         Behavior: Behavior normal. Behavior is cooperative.        Thought Content: Thought content normal.        Cognition and Memory: Cognition and memory normal.        Judgment: Judgment normal.  Assessment  Plan  Annual physical exam - Plan: Comprehensive metabolic panel, CBC with Differential/Platelet, TSH, Urinalysis, Routine w reflex microscopic Needs flu shot - Plan: Flu Vaccine QUAD 6+ mos PF IM (Fluarix Quad PF) Tdap given today  See below    B12 deficiency - Plan: Vitamin B12  Vitamin B12 deficiency - Plan: Vitamin B12  Vitamin D deficiency - Plan: Vitamin D (25 hydroxy)  Screening-pulmonary TB - Plan: QuantiFERON-TB Gold Plus  Anxiety and depression - Plan: FLUoxetine (PROZAC) 20 MG capsule, buPROPion (WELLBUTRIN XL) 150 MG 24 hr tablet   Hm -see above Fasting labs utd  Flu shot today tdap today covid 2/2 pfizer consider booster declines for now consider 4    F/u ob/gyn Dr. Gala Romney heavy menses as of 08/06/19 still having on provera    Referred dermatology prev left forehead lesion, acne skin cancer check given treitinoin    rec healthy diet and exercise   Declines hiv, hep C testing   specialists Ob/gyn Dr. Gala Romney  Eye Dr. Annamaria Boots  Dentist Dr. Georgie Chard  Prior PCP Dr. Bertell Maria [place]   Provider: Dr. Olivia Mackie McLean-Scocuzza-Internal Medicine

## 2021-02-17 LAB — CBC WITH DIFFERENTIAL/PLATELET
Basophils Absolute: 0 10*3/uL (ref 0.0–0.1)
Basophils Relative: 0.6 % (ref 0.0–3.0)
Eosinophils Absolute: 0.1 10*3/uL (ref 0.0–0.7)
Eosinophils Relative: 2.2 % (ref 0.0–5.0)
HCT: 39.6 % (ref 36.0–46.0)
Hemoglobin: 13.2 g/dL (ref 12.0–15.0)
Lymphocytes Relative: 30.1 % (ref 12.0–46.0)
Lymphs Abs: 2 10*3/uL (ref 0.7–4.0)
MCHC: 33.3 g/dL (ref 30.0–36.0)
MCV: 85.2 fl (ref 78.0–100.0)
Monocytes Absolute: 0.4 10*3/uL (ref 0.1–1.0)
Monocytes Relative: 5.8 % (ref 3.0–12.0)
Neutro Abs: 4.2 10*3/uL (ref 1.4–7.7)
Neutrophils Relative %: 61.3 % (ref 43.0–77.0)
Platelets: 332 10*3/uL (ref 150.0–400.0)
RBC: 4.64 Mil/uL (ref 3.87–5.11)
RDW: 12.1 % (ref 11.5–14.6)
WBC: 6.8 10*3/uL (ref 4.5–10.5)

## 2021-02-17 LAB — COMPREHENSIVE METABOLIC PANEL
ALT: 19 U/L (ref 0–35)
AST: 19 U/L (ref 0–37)
Albumin: 4.1 g/dL (ref 3.5–5.2)
Alkaline Phosphatase: 49 U/L (ref 39–117)
BUN: 8 mg/dL (ref 6–23)
CO2: 24 mEq/L (ref 19–32)
Calcium: 9.3 mg/dL (ref 8.4–10.5)
Chloride: 102 mEq/L (ref 96–112)
Creatinine, Ser: 0.66 mg/dL (ref 0.40–1.20)
GFR: 126.37 mL/min (ref >60.00)
Glucose, Bld: 77 mg/dL (ref 70–99)
Potassium: 3.6 mEq/L (ref 3.5–5.1)
Sodium: 135 mEq/L (ref 135–145)
Total Bilirubin: 0.3 mg/dL (ref 0.2–1.2)
Total Protein: 6.9 g/dL (ref 6.0–8.3)

## 2021-02-17 LAB — LIPID PANEL
Cholesterol: 211 mg/dL — ABNORMAL HIGH (ref 0–200)
HDL: 60.3 mg/dL (ref >39.00)
LDL Cholesterol: 126 mg/dL — ABNORMAL HIGH (ref 0–99)
NonHDL: 151.13
Total CHOL/HDL Ratio: 4
Triglycerides: 126 mg/dL (ref 0.0–149.0)
VLDL: 25.2 mg/dL (ref 0.0–40.0)

## 2021-02-17 LAB — VITAMIN B12: Vitamin B-12: 335 pg/mL (ref 211–911)

## 2021-02-17 LAB — TSH: TSH: 1.35 u[IU]/mL (ref 0.35–5.50)

## 2021-02-17 LAB — VITAMIN D 25 HYDROXY (VIT D DEFICIENCY, FRACTURES): VITD: 34.79 ng/mL (ref 30.00–100.00)

## 2021-02-18 LAB — QUANTIFERON-TB GOLD PLUS
Mitogen-NIL: >10 IU/mL
NIL: 0.04 IU/mL
QuantiFERON-TB Gold Plus: NEGATIVE
TB1-NIL: 0 IU/mL
TB2-NIL: 0.01 IU/mL

## 2021-02-18 LAB — URINALYSIS, ROUTINE W REFLEX MICROSCOPIC
Bilirubin Urine: NEGATIVE
Glucose, UA: NEGATIVE
Hgb urine dipstick: NEGATIVE
Ketones, ur: NEGATIVE
Leukocytes,Ua: NEGATIVE
Nitrite: NEGATIVE
Protein, ur: NEGATIVE
Specific Gravity, Urine: 1.005 (ref 1.001–1.035)
pH: 7 (ref 5.0–8.0)

## 2021-02-18 LAB — MEASLES/MUMPS/RUBELLA IMMUNITY
Mumps IgG: 188 AU/mL
Rubella: 4.48 Index
Rubeola IgG: 212 AU/mL

## 2021-02-18 LAB — URINE CULTURE
MICRO NUMBER:: 12804269
SPECIMEN QUALITY:: ADEQUATE

## 2021-02-18 LAB — VARICELLA ZOSTER ANTIBODY, IGG: Varicella IgG: 2740 index

## 2021-02-18 LAB — HEPATITIS B SURFACE ANTIBODY, QUANTITATIVE: Hep B S AB Quant (Post): 10 m[IU]/mL (ref >=10)

## 2021-02-23 ENCOUNTER — Telehealth: Payer: Self-pay | Admitting: Internal Medicine

## 2021-02-23 ENCOUNTER — Other Ambulatory Visit: Payer: Self-pay

## 2021-02-23 MED ORDER — FLUCONAZOLE 150 MG PO TABS
150.0000 mg | ORAL_TABLET | Freq: Once | ORAL | 0 refills | Status: AC
  Filled 2021-02-23: qty 1, 1d supply, fill #0

## 2021-02-23 MED ORDER — AMOXICILLIN-POT CLAVULANATE 875-125 MG PO TABS
1.0000 | ORAL_TABLET | Freq: Two times a day (BID) | ORAL | 0 refills | Status: DC
  Filled 2021-02-23: qty 14, 7d supply, fill #0

## 2021-02-23 NOTE — Addendum Note (Signed)
Addended by: Orland Mustard on: 02/23/2021 05:29 PM   Modules accepted: Orders

## 2021-02-23 NOTE — Telephone Encounter (Signed)
Pt called stating she had an appt on last week and she tested positive for a UTI but she has not received the medication yet

## 2021-02-23 NOTE — Telephone Encounter (Signed)
Thressa Sheller, CMA  02/23/2021  3:46 PM EST Back to Top    Patient informed and verbalized understanding.    Patient agreeable to antibiotics, [facility] [address] for this [facility].   Result note was forwarded to provider to fill medication.

## 2021-02-24 ENCOUNTER — Other Ambulatory Visit: Payer: Self-pay | Admitting: Internal Medicine

## 2021-02-24 ENCOUNTER — Other Ambulatory Visit: Payer: Self-pay

## 2021-02-24 DIAGNOSIS — R300 Dysuria: Secondary | ICD-10-CM

## 2021-02-24 DIAGNOSIS — N3000 Acute cystitis without hematuria: Secondary | ICD-10-CM

## 2021-02-24 MED ORDER — AMOXICILLIN-POT CLAVULANATE 875-125 MG PO TABS: 1.0000 | ORAL_TABLET | Freq: Two times a day (BID) | ORAL | 0 refills | Status: DC

## 2021-02-24 MED ORDER — PHENAZOPYRIDINE HCL 200 MG PO TABS: 200.0000 mg | ORAL_TABLET | Freq: Three times a day (TID) | ORAL | 0 refills | Status: DC | PRN

## 2021-02-24 NOTE — Telephone Encounter (Signed)
Called [facility] and this medication is in stock. Antibiotics sent there. Patient's mother informed and verbalized understanding.   Still needing the pyridium sent in.

## 2021-02-24 NOTE — Telephone Encounter (Signed)
Sent in [facility]

## 2021-02-24 NOTE — Telephone Encounter (Signed)
Patient mother coming in office. States the pharmacy informed them that amoxicillin-clavulanate (AUGMENTIN) 875-125 MG tablet is on back order. Requesting an alternative be sent in.   Patient has picked up the diflucan.   Patient's mother states the Patient is in pain and felling very uncomfortable. Requesting that Pyridium, also be sent in for the Patient.   Please send in Pyridium and antibiotic alternative as soon as possible as Patient's mother is on her way back to the pharmacy.   Placed hand written note from pharmacy on your desk that was handed to Patient and her mother.

## 2021-02-28 ENCOUNTER — Encounter: Payer: Self-pay | Admitting: *Deleted

## 2021-03-08 ENCOUNTER — Other Ambulatory Visit: Payer: Self-pay

## 2021-03-08 MED FILL — Norethindrone & Ethinyl Estradiol Tab 1 MG-35 MCG: ORAL | 28 days supply | Qty: 28 | Fill #3 | Status: AC

## 2021-03-18 ENCOUNTER — Other Ambulatory Visit: Payer: Self-pay

## 2021-03-18 ENCOUNTER — Other Ambulatory Visit: Payer: Self-pay | Admitting: Internal Medicine

## 2021-03-18 DIAGNOSIS — B379 Candidiasis, unspecified: Secondary | ICD-10-CM

## 2021-03-18 MED ORDER — FLUCONAZOLE 150 MG PO TABS
150.0000 mg | ORAL_TABLET | Freq: Once | ORAL | 0 refills | Status: AC
  Filled 2021-03-18: qty 2, 3d supply, fill #0

## 2021-04-06 ENCOUNTER — Other Ambulatory Visit: Payer: Self-pay

## 2021-04-06 MED FILL — Norethindrone & Ethinyl Estradiol Tab 1 MG-35 MCG: ORAL | 28 days supply | Qty: 28 | Fill #4 | Status: AC

## 2021-05-05 ENCOUNTER — Other Ambulatory Visit: Payer: Self-pay

## 2021-05-05 MED FILL — Norethindrone & Ethinyl Estradiol Tab 1 MG-35 MCG: ORAL | 28 days supply | Qty: 28 | Fill #5 | Status: AC

## 2021-06-02 ENCOUNTER — Other Ambulatory Visit: Payer: Self-pay

## 2021-06-02 MED FILL — Norethindrone & Ethinyl Estradiol Tab 1 MG-35 MCG: ORAL | 28 days supply | Qty: 28 | Fill #6 | Status: AC

## 2021-06-03 ENCOUNTER — Other Ambulatory Visit: Payer: Self-pay

## 2021-06-03 MED FILL — Norethindrone & Ethinyl Estradiol Tab 1 MG-35 MCG: ORAL | 28 days supply | Qty: 28 | Fill #7 | Status: CN

## 2021-06-10 ENCOUNTER — Other Ambulatory Visit: Payer: Self-pay

## 2021-06-10 ENCOUNTER — Ambulatory Visit: Payer: BC Managed Care – PPO | Admitting: Internal Medicine

## 2021-06-10 ENCOUNTER — Encounter: Payer: Self-pay | Admitting: Internal Medicine

## 2021-06-10 VITALS — BP 110/70 | HR 93 | Temp 99.0°F | Resp 14 | Ht 62.91 in | Wt 148.0 lb

## 2021-06-10 DIAGNOSIS — J029 Acute pharyngitis, unspecified: Secondary | ICD-10-CM | POA: Diagnosis not present

## 2021-06-10 DIAGNOSIS — B3731 Acute candidiasis of vulva and vagina: Secondary | ICD-10-CM | POA: Diagnosis not present

## 2021-06-10 DIAGNOSIS — R590 Localized enlarged lymph nodes: Secondary | ICD-10-CM | POA: Diagnosis not present

## 2021-06-10 DIAGNOSIS — R110 Nausea: Secondary | ICD-10-CM | POA: Diagnosis not present

## 2021-06-10 LAB — CBC WITH DIFFERENTIAL/PLATELET
Basophils Absolute: 0 10*3/uL (ref 0.0–0.1)
Basophils Relative: 0.7 % (ref 0.0–3.0)
Eosinophils Absolute: 0 10*3/uL (ref 0.0–0.7)
Eosinophils Relative: 0.7 % (ref 0.0–5.0)
HCT: 36.2 % (ref 36.0–46.0)
Hemoglobin: 12.2 g/dL (ref 12.0–15.0)
Lymphocytes Relative: 27.7 % (ref 12.0–46.0)
Lymphs Abs: 1 10*3/uL (ref 0.7–4.0)
MCHC: 33.7 g/dL (ref 30.0–36.0)
MCV: 83.7 fl (ref 78.0–100.0)
Monocytes Absolute: 0.4 10*3/uL (ref 0.1–1.0)
Monocytes Relative: 9.9 % (ref 3.0–12.0)
Neutro Abs: 2.2 10*3/uL (ref 1.4–7.7)
Neutrophils Relative %: 61 % (ref 43.0–77.0)
Platelets: 174 10*3/uL (ref 150.0–400.0)
RBC: 4.33 Mil/uL (ref 3.87–5.11)
RDW: 12.7 % (ref 11.5–14.6)
WBC: 3.7 10*3/uL — ABNORMAL LOW (ref 4.5–10.5)

## 2021-06-10 LAB — MONONUCLEOSIS SCREEN: Mono Screen: NEGATIVE

## 2021-06-10 LAB — POCT RAPID STREP A (OFFICE): Rapid Strep A Screen: NEGATIVE

## 2021-06-10 MED ORDER — AMOXICILLIN-POT CLAVULANATE 875-125 MG PO TABS
1.0000 | ORAL_TABLET | Freq: Two times a day (BID) | ORAL | 0 refills | Status: DC
  Filled 2021-06-10: qty 20, 10d supply, fill #0

## 2021-06-10 MED ORDER — METHYLPREDNISOLONE SODIUM SUCC 40 MG IJ SOLR
40.0000 mg | Freq: Once | INTRAMUSCULAR | Status: AC
  Administered 2021-06-10: 40 mg via INTRAMUSCULAR

## 2021-06-10 MED ORDER — FLUCONAZOLE 150 MG PO TABS
150.0000 mg | ORAL_TABLET | Freq: Once | ORAL | 0 refills | Status: AC
  Filled 2021-06-10: qty 1, 1d supply, fill #0

## 2021-06-10 MED ORDER — ONDANSETRON HCL 4 MG PO TABS
4.0000 mg | ORAL_TABLET | Freq: Three times a day (TID) | ORAL | 0 refills | Status: DC | PRN
  Filled 2021-06-10: qty 40, 14d supply, fill #0

## 2021-06-10 NOTE — Patient Instructions (Signed)
Lymphadenopathy  Lymphadenopathy means that your lymph glands are swollen or larger than normal. Lymph glands, also called lymph nodes, are collections of tissue that filter excess fluid, bacteria, viruses, and waste from your bloodstream. They are part of your body's disease-fighting system (immune system), which protects your body from germs. There may be different causes of lymphadenopathy, depending on where it is in your body. Some types go away on their own. Lymphadenopathy can occur anywhere that you have lymph glands, including these areas: Neck (cervical lymphadenopathy). Chest (mediastinal lymphadenopathy). Lungs (hilar lymphadenopathy). Underarms (axillary lymphadenopathy). Groin (inguinal lymphadenopathy). When your immune system responds to germs, infection-fighting cells and fluid build up in your lymph glands. This causes some swelling and enlargement. If the lymph nodes do not go back to normal size after you have an infection or disease, your health care provider may do tests. These tests help to monitor your condition and find the reason why the glands are still swollen and enlarged. Follow these instructions at home:  Get plenty of rest. Your health care provider may recommend over-the-counter medicines for pain. Take over-the-counter and prescription medicines only as told by your health care provider. If directed, apply heat to swollen lymph glands as often as told by your health care provider. Use the heat source that your health care provider recommends, such as a moist heat pack or a heating pad. Place a towel between your skin and the heat source. Leave the heat on for 20-30 minutes. Remove the heat if your skin turns bright red. This is especially important if you are unable to feel pain, heat, or cold. You may have a greater risk of getting burned. Check your affected lymph glands every day for changes. Check other lymph gland areas as told by your health care provider.  Check for changes such as: More swelling. Sudden increase in size. Redness or pain. Hardness. Keep all follow-up visits. This is important. Contact a health care provider if you have: Lymph glands that: Are still swollen after 2 weeks. Have suddenly gotten bigger or the swelling spreads. Are red, painful, or hard. Fluid leaking from the skin near an enlarged lymph gland. Problems with breathing. A fever, chills, or night sweats. Fatigue. A sore throat. Pain in your abdomen. Weight loss. Get help right away if you have: Severe pain. Chest pain. Shortness of breath. These symptoms may represent a serious problem that is an emergency. Do not wait to see if the symptoms will go away. Get medical help right away. Call your local emergency services (911 in the [place] Do not drive yourself to the hospital. Summary Lymphadenopathy means that your lymph glands are swollen or larger than normal. Lymph glands, also called lymph nodes, are collections of tissue that filter excess fluid, bacteria, viruses, and waste from the bloodstream. They are part of your body's disease-fighting system (immune system). Lymphadenopathy can occur anywhere that you have lymph glands. If the lymph nodes do not go back to normal size after you have an infection or disease, your health care provider may do tests to monitor your condition and find the reason why the glands are still swollen and enlarged. Check your affected lymph glands every day for changes. Check other lymph gland areas as told by your health care provider. This information is not intended to replace advice given to you by your health care provider. Make sure you discuss any questions you have with your health care provider. Document Revised: 12/03/2019 Document Reviewed: 12/03/2019 Elsevier Patient Education    [place]. ? ?

## 2021-06-10 NOTE — Progress Notes (Signed)
Chief Complaint  ?Patient presents with  ? Acute Visit  ?  Swollen lymph nodes in both sides of neck mainly in L concerned it may be from allergies. She also c/o swollen eyes,sore throat. Treating with otc flonase and allerga w/o relief.  ? ?F/u with mom swollen lymph nodes bl neck and sore throat, eyelids swollen as well. Mom recently sick as well ? Allergic but she is tired tried flonase and allegra w/o relief and advil. Denies insect bite. She has also had h/a and nausea  ? ? ?Review of Systems  ?HENT:  Positive for sore throat.   ?Gastrointestinal:  Positive for nausea.  ?Neurological:  Positive for headaches.  ?Past Medical History:  ?Diagnosis Date  ? Allergy   ? Anorexia   ? age 21 yo as of 21 y.o not active   ? Chronic headaches   ? Depression, major, single episode   ? Eating disorder 2015  ? resolved.  ? Migraines   ? PONV (postoperative nausea and vomiting)   ? UTI (urinary tract infection)   ? ?Past Surgical History:  ?Procedure Laterality Date  ? FOOT SURGERY    ? tailors bunion 2019  ? GANGLION CYST EXCISION Left 07/30/2018  ? Procedure: REMOVAL GANGLION OF LEFT DORSAL WRIST;  Surgeon: Leanora Cover, MD;  Location: [place];  Service: Orthopedics;  Laterality: Left;  bier block  ? TONSILLECTOMY    ? 2008  ? TONSILLECTOMY AND ADENOIDECTOMY    ? TYMPANOSTOMY TUBE PLACEMENT    ? WISDOM TOOTH EXTRACTION    ? ?Family History  ?Problem Relation Age of Onset  ? Diabetes Mother   ?     type 1  ? Heart disease Mother   ? Arthritis Mother   ? Learning disabilities Mother   ? Miscarriages / [place] Mother   ? Allergies Father   ? Kidney disease Father   ? Hypertension Maternal Grandmother   ? Anxiety disorder Maternal Grandmother   ? Hyperlipidemia Maternal Grandmother   ? Mental illness Maternal Grandmother   ? Hypertension Maternal Grandfather   ? Arthritis Maternal Grandfather   ? Drug abuse Maternal Grandfather   ? Hearing loss Maternal Grandfather   ? Heart disease Maternal Grandfather   ?  Hyperlipidemia Maternal Grandfather   ? Heart disease Paternal Grandmother   ? Arthritis Paternal Grandmother   ? Hypertension Paternal Grandmother   ? Cancer Paternal Grandfather   ?     skin melanoma  ? ?Social History  ? ?Socioeconomic History  ? Marital status: Single  ?  Spouse name: Not on file  ? Number of children: Not on file  ? Years of education: Not on file  ? Highest education level: Not on file  ?Occupational History  ? Not on file  ?Tobacco Use  ? Smoking status: Never  ? Smokeless tobacco: Never  ?Vaping Use  ? Vaping Use: Never used  ?Substance and Sexual Activity  ? Alcohol use: No  ? Drug use: No  ? Sexual activity: Never  ?  Birth control/protection: I.U.D.  ?Other Topics Concern  ? Not on file  ?Social History Narrative  ? 1 twin sister Lenna Sciara   ? Lives with sister and mom   ? Moved from [place]   ? In [employer] interested in medicine pre-med 01/2021 graduated will do phlebotomy and take mcat  ? ?Social Determinants of Health  ? ?Financial Resource Strain: Not on file  ?Food Insecurity: Not on file  ?Transportation Needs: Not on  file  ?Physical Activity: Not on file  ?Stress: Not on file  ?Social Connections: Not on file  ?Intimate Partner Violence: Not on file  ? ?Current Meds  ?Medication Sig  ? amoxicillin-clavulanate (AUGMENTIN) 875-125 MG tablet Take 1 tablet by mouth 2 (two) times daily. With food  ? amoxicillin-clavulanate (AUGMENTIN) 875-125 MG tablet Take 1 tablet by mouth 2 (two) times daily. X 7-10 days With food  ? buPROPion (WELLBUTRIN XL) 150 MG 24 hr tablet TAKE 1 TABLET BY MOUTH DAILY.  ? cholecalciferol (VITAMIN D3) 25 MCG (1000 UNIT) tablet Take 1,000 Units by mouth daily.  ? Ferrous Sulfate (IRON PO) Take by mouth.  ? fluconazole (DIFLUCAN) 150 MG tablet Take 1 tablet (150 mg total) by mouth once for 1 dose.  ? FLUoxetine (PROZAC) 20 MG capsule TAKE 1 CAPSULE BY MOUTH DAILY.  ? fluticasone (FLONASE) 50 MCG/ACT nasal spray Place into both nostrils daily.  ? Levonorgestrel ([name]) 19.5  MG IUD by Intrauterine route.  ? norethindrone-ethinyl estradiol 1/35 (ALAYCEN 1/35) tablet TAKE 1 TABLET BY MOUTH DAILY  ? ondansetron (ZOFRAN) 4 MG tablet Take 1 tablet (4 mg total) by mouth every 8 (eight) hours as needed.  ? phenazopyridine (PYRIDIUM) 200 MG tablet Take 1 tablet (200 mg total) by mouth 3 (three) times daily as needed for pain.  ? tretinoin (RETIN-A) 0.025 % cream   ? vitamin B-12 (CYANOCOBALAMIN) 500 MCG tablet Take 500 mcg by mouth daily.  ? ?Allergies  ?Allergen Reactions  ? Fentanyl   ?  Nausea ?  ? Versed [Midazolam]   ?  nausea  ? ?No results found for this or any previous visit (from the past 2160 hour(s)). ?Objective  ?Body mass index is 26.29 kg/m?. ?Wt Readings from Last 3 Encounters:  ?06/10/21 148 lb (67.1 kg)  ?02/16/21 148 lb 3.2 oz (67.2 kg)  ?07/26/20 136 lb 14.5 oz (62.1 kg) (65 %, Z= 0.39)*  ? ?* Growth percentiles are based on CDC (Girls, 2-20 Years) data.  ? ?Temp Readings from Last 3 Encounters:  ?06/10/21 99 ?F (37.2 ?C) (Oral)  ?02/16/21 (!) 97.2 ?F (36.2 ?C) (Temporal)  ?07/26/20 98.4 ?F (36.9 ?C) (Oral)  ? ?BP Readings from Last 3 Encounters:  ?06/10/21 110/70  ?02/16/21 110/68  ?07/26/20 115/72  ? ?Pulse Readings from Last 3 Encounters:  ?06/10/21 93  ?02/16/21 82  ?07/26/20 76  ? ? ?Physical Exam ?Vitals and nursing note reviewed.  ?Constitutional:   ?   Appearance: Normal appearance. She is well-developed and well-groomed.  ?HENT:  ?   Head: Normocephalic and atraumatic.  ?Eyes:  ?   Conjunctiva/sclera: Conjunctivae normal.  ?   Pupils: Pupils are equal, round, and reactive to light.  ?Cardiovascular:  ?   Rate and Rhythm: Normal rate and regular rhythm.  ?   Heart sounds: Normal heart sounds. No murmur heard. ?Pulmonary:  ?   Effort: Pulmonary effort is normal.  ?   Breath sounds: Normal breath sounds.  ?Abdominal:  ?   General: Abdomen is flat. Bowel sounds are normal.  ?   Tenderness: There is no abdominal tenderness.  ?Musculoskeletal:     ?   General: No  tenderness.  ?Lymphadenopathy:  ?   Cervical: Cervical adenopathy present.  ?Skin: ?   General: Skin is warm and dry.  ?Neurological:  ?   General: No focal deficit present.  ?   Mental Status: She is alert and oriented to person, place, and time. Mental status is at baseline.  ?  Cranial Nerves: Cranial nerves 2-12 are intact.  ?   Gait: Gait is intact.  ?Psychiatric:     ?   Attention and Perception: Attention and perception normal.     ?   Mood and Affect: Mood and affect normal.     ?   Speech: Speech normal.     ?   Behavior: Behavior normal. Behavior is cooperative.     ?   Thought Content: Thought content normal.     ?   Cognition and Memory: Cognition and memory normal.     ?   Judgment: Judgment normal.  ? ? ?Assessment  ?Plan  ?Cervical lymphadenopathy - Plan: Mononucleosis screen, COVID-19, Flu A+B and RSV, POCT rapid strep A, CBC with Differential/Platelet ? ?Sore throat - Plan: Mononucleosis screen, COVID-19, Flu A+B and RSV, POCT rapid strep A, amoxicillin-clavulanate (AUGMENTIN) 875-125 MG tablet, CBC with Differential/Platelet ? ?Nausea - Plan: ondansetron (ZOFRAN) 4 MG tablet ? ?Yeast vaginitis - Plan: fluconazole (DIFLUCAN) 150 MG tablet  ? ?Swollen eyelids  ?Depomedrol 40 x1 continue allegra prn  ?Consider allergy in the future if needed  ?Provider: Dr. Olivia Mackie McLean-Scocuzza-Internal Medicine  ?

## 2021-06-11 LAB — COVID-19, FLU A+B AND RSV
Influenza A, NAA: NOT DETECTED
Influenza B, NAA: NOT DETECTED
RSV, NAA: NOT DETECTED
SARS-CoV-2, NAA: NOT DETECTED

## 2021-06-13 ENCOUNTER — Encounter: Payer: Self-pay | Admitting: Internal Medicine

## 2021-06-16 ENCOUNTER — Ambulatory Visit
Admission: RE | Admit: 2021-06-16 | Discharge: 2021-06-16 | Disposition: A | Discharge status: Home / Self Care | Payer: BC Managed Care – PPO | Source: Ambulatory Visit | Attending: Internal Medicine | Admitting: Internal Medicine

## 2021-06-16 VITALS — BP 116/75 | HR 88 | Temp 98.5°F | Resp 16 | Ht 62.91 in | Wt 148.0 lb

## 2021-06-16 DIAGNOSIS — G589 Mononeuropathy, unspecified: Secondary | ICD-10-CM

## 2021-06-16 DIAGNOSIS — R161 Splenomegaly, not elsewhere classified: Secondary | ICD-10-CM

## 2021-06-16 DIAGNOSIS — R591 Generalized enlarged lymph nodes: Secondary | ICD-10-CM | POA: Diagnosis not present

## 2021-06-16 LAB — CBC WITH DIFFERENTIAL/PLATELET
Abs Immature Granulocytes: 0 10*3/uL (ref 0.00–0.07)
Basophils Absolute: 0 10*3/uL (ref 0.0–0.1)
Basophils Relative: 0 %
Eosinophils Absolute: 0 10*3/uL (ref 0.0–0.5)
Eosinophils Relative: 0 %
HCT: 36.3 % (ref 36.0–46.0)
Hemoglobin: 12 g/dL (ref 12.0–15.0)
Lymphocytes Relative: 73 %
Lymphs Abs: 6.6 10*3/uL — ABNORMAL HIGH (ref 0.7–4.0)
MCH: 28 pg (ref 26.0–34.0)
MCHC: 33.1 g/dL (ref 30.0–36.0)
MCV: 84.6 fL (ref 80.0–100.0)
Monocytes Absolute: 0.5 10*3/uL (ref 0.1–1.0)
Monocytes Relative: 6 %
Neutro Abs: 1.9 10*3/uL (ref 1.7–7.7)
Neutrophils Relative %: 21 %
Platelets: 240 10*3/uL (ref 150–400)
RBC: 4.29 MIL/uL (ref 3.87–5.11)
RDW: 13.2 % (ref 11.5–15.5)
Smear Review: NORMAL
WBC Morphology: ABNORMAL
WBC: 9 10*3/uL (ref 4.0–10.5)
nRBC: 0 % (ref 0.0–0.2)

## 2021-06-16 LAB — URINALYSIS, MICROSCOPIC (REFLEX): Squamous Epithelial / HPF: >50 (ref 0–5)

## 2021-06-16 LAB — URINALYSIS, ROUTINE W REFLEX MICROSCOPIC
Bilirubin Urine: NEGATIVE
Glucose, UA: NEGATIVE mg/dL
Nitrite: NEGATIVE
Protein, ur: NEGATIVE mg/dL
Specific Gravity, Urine: 1.02 (ref 1.005–1.030)
pH: 5.5 (ref 5.0–8.0)

## 2021-06-16 LAB — MONONUCLEOSIS SCREEN: Mono Screen: POSITIVE — AB

## 2021-06-16 NOTE — Discharge Instructions (Signed)
Avoid any physical activity that my involve your abdomen getting hit in any way. ?Rest when you are tired ?Call your primary care doctor and tell them your test for mono turned positive and if they still want you to get the ultrasound. ? ?

## 2021-06-16 NOTE — ED Triage Notes (Signed)
Pt reports abdominal pain beginning this week,  lymph node swelling x 1.5 weeks. facial swelling x1.5 weeks. States went to PCP 6 days ago and tested negative for Strep, Mono, Covid, Flu, and RSV. Also reports PCP wanted her to return to get an ultrasound of lymph nodes in 2 weeks if no improvement. Received DepoMedrol and antibiotics with no improvement.  ?

## 2021-06-16 NOTE — ED Provider Notes (Signed)
?[place] ? ? ? ?CSN: [id number] ?Arrival date & time: 06/16/21  1652 ? ? ?  ? ?History   ?Chief Complaint ?Chief Complaint  ?Patient presents with  ? Abdominal Pain  ?  Abdominal pain and swelling with lymph node swelling, extreme facial swelling, and headache - Entered by patient  ? Lymphadenopathy  ? Headache  ? ? ?HPI ?Andrea Stevens is a 21 y.o. female who presents with persistent posterior neck node swelling, upper abdominal pain, and face swelling for 1.5 weeks. She saw her PCP 2 days after onset of swollen nodes and had neg test for strep, mono, covid , flu and RSV. She was placed on antibiotics to help with lymph node enlargement.  ? ? ? ?Past Medical History:  ?Diagnosis Date  ? Allergy   ? Anorexia   ? age 65 yo as of 21 y.o not active   ? Chronic headaches   ? Depression, major, single episode   ? Eating disorder 2015  ? resolved.  ? Migraines   ? PONV (postoperative nausea and vomiting)   ? UTI (urinary tract infection)   ? ? ?Patient Active Problem List  ? Diagnosis Date Noted  ? B12 deficiency 02/26/2020  ? Depression, recurrent ([place]) 08/06/2019  ? Anxiety 08/06/2019  ? DUB (dysfunctional uterine bleeding) 08/06/2019  ? Fatigue 12/10/2018  ? Annual physical exam 12/10/2018  ? Iron deficiency 12/10/2018  ? Menorrhagia with regular cycle 12/10/2018  ? Acne 12/10/2018  ? Vitamin D deficiency 12/10/2018  ? Tailor's bunion of left foot 04/17/2018  ? Anxiety and depression 05/04/2017  ? Ganglion cyst of dorsum of left wrist 02/26/2017  ? Non-suicidal self harm 04/09/2015  ? Single current episode of major depressive disorder 04/09/2015  ? Acute pain of left wrist 02/11/2015  ? Injury of left elbow 02/11/2015  ? Eating disorder 08/27/2013  ? ALLERGIC RHINITIS CAUSE UNSPECIFIED 04/18/2007  ? Constipation 04/18/2007  ? HEADACHE 04/18/2007  ? ? ?Past Surgical History:  ?Procedure Laterality Date  ? FOOT SURGERY    ? tailors bunion 2019  ? GANGLION CYST EXCISION Left 07/30/2018  ? Procedure: REMOVAL  GANGLION OF LEFT DORSAL WRIST;  Surgeon: Leanora Cover, MD;  Location: [facility];  Service: Orthopedics;  Laterality: Left;  bier block  ? TONSILLECTOMY    ? 2008  ? TONSILLECTOMY AND ADENOIDECTOMY    ? TYMPANOSTOMY TUBE PLACEMENT    ? WISDOM TOOTH EXTRACTION    ? ? ?OB History   ? ? Gravida  ?0  ? Para  ?0  ? Term  ?0  ? Preterm  ?0  ? AB  ?0  ? Living  ?0  ?  ? ? SAB  ?0  ? IAB  ?0  ? Ectopic  ?0  ? Multiple  ?0  ? Live Births  ?0  ?   ?  ?  ? ? ? ?Home Medications   ? ?Prior to Admission medications   ?Medication Sig Start Date End Date Taking? Authorizing Provider  ?amoxicillin-clavulanate (AUGMENTIN) 875-125 MG tablet Take 1 tablet by mouth 2 (two) times daily. With food 02/24/21   McLean-Scocuzza, Nino Glow, MD  ?amoxicillin-clavulanate (AUGMENTIN) 875-125 MG tablet Take 1 tablet by mouth 2 (two) times daily. X 7-10 days With food 06/10/21   McLean-Scocuzza, Nino Glow, MD  ?buPROPion (WELLBUTRIN XL) 150 MG 24 hr tablet TAKE 1 TABLET BY MOUTH DAILY. 02/16/21 02/16/22  McLean-Scocuzza, Nino Glow, MD  ?cholecalciferol (VITAMIN D3) 25 MCG (1000 UNIT) tablet  Take 1,000 Units by mouth daily.    [provider]  ?Ferrous Sulfate (IRON PO) Take by mouth.    [provider]  ?FLUoxetine (PROZAC) 20 MG capsule TAKE 1 CAPSULE BY MOUTH DAILY. 02/16/21 02/16/22  McLean-Scocuzza, Nino Glow, MD  ?fluticasone (FLONASE) 50 MCG/ACT nasal spray Place into both nostrils daily.    [provider]  ?Levonorgestrel ([name]) 19.5 MG IUD by Intrauterine route.    [provider]  ?norethindrone-ethinyl estradiol 1/35 (ALAYCEN 1/35) tablet TAKE 1 TABLET BY MOUTH DAILY 12/10/20   Anyanwu, Sallyanne Havers, MD  ?ondansetron (ZOFRAN) 4 MG tablet Take 1 tablet (4 mg total) by mouth every 8 (eight) hours as needed. 06/10/21   McLean-Scocuzza, Nino Glow, MD  ?phenazopyridine (PYRIDIUM) 200 MG tablet Take 1 tablet (200 mg total) by mouth 3 (three) times daily as needed for pain. 02/24/21   McLean-Scocuzza, Nino Glow,  MD  ?tretinoin (RETIN-A) 0.025 % cream  01/23/19   [provider]  ?vitamin B-12 (CYANOCOBALAMIN) 500 MCG tablet Take 500 mcg by mouth daily.    [provider]  ?Norethindrone Acetate-Ethinyl Estrad-FE (LOESTRIN 24 FE) 1-20 MG-MCG(24) tablet Take 1 tablet by mouth daily. Take hormonal tablets only in continuous fashion 10/23/19 01/13/20  Anyanwu, Sallyanne Havers, MD  ? ? ?Family History ?Family History  ?Problem Relation Age of Onset  ? Diabetes Mother   ?     type 1  ? Heart disease Mother   ? Arthritis Mother   ? Learning disabilities Mother   ? Miscarriages / [place] Mother   ? Allergies Father   ? Kidney disease Father   ? Hypertension Maternal Grandmother   ? Anxiety disorder Maternal Grandmother   ? Hyperlipidemia Maternal Grandmother   ? Mental illness Maternal Grandmother   ? Hypertension Maternal Grandfather   ? Arthritis Maternal Grandfather   ? Drug abuse Maternal Grandfather   ? Hearing loss Maternal Grandfather   ? Heart disease Maternal Grandfather   ? Hyperlipidemia Maternal Grandfather   ? Heart disease Paternal Grandmother   ? Arthritis Paternal Grandmother   ? Hypertension Paternal Grandmother   ? Cancer Paternal Grandfather   ?     skin melanoma  ? ? ?Social History ?Social History  ? ?Tobacco Use  ? Smoking status: Never  ? Smokeless tobacco: Never  ?Vaping Use  ? Vaping Use: Never used  ?Substance Use Topics  ? Alcohol use: No  ? Drug use: No  ? ? ? ?Allergies   ?Fentanyl and Versed [midazolam] ? ? ?Review of Systems ?Review of Systems  ?Constitutional:  Positive for fatigue. Negative for fever.  ?HENT:  Negative for congestion.   ?Respiratory:  Negative for cough.   ?Gastrointestinal:  Positive for abdominal pain. Negative for diarrhea, nausea and vomiting.  ?Skin:  Negative for rash.  ?Hematological:  Positive for adenopathy.  ? ? ?Physical Exam ?Triage Vital Signs ?ED Triage Vitals  ?Enc Vitals Group  ?   BP 06/16/21 1707 116/75  ?   Pulse Rate 06/16/21 1707 88  ?   Resp  06/16/21 1707 16  ?   Temp 06/16/21 1707 98.5 ?F (36.9 ?C)  ?   Temp Source 06/16/21 1707 Oral  ?   SpO2 06/16/21 1707 98 %  ?   Weight 06/16/21 1708 148 lb (67.1 kg)  ?   Height 06/16/21 1708 5' 2.91" (1.598 m)  ?   Head Circumference --   ?   Peak Flow --   ?   Pain  Score 06/16/21 1707 2  ?   Pain Loc --   ?   Pain Edu? --   ?   Excl. in [place]? --   ? ?No data found. ? ?Updated Vital Signs ?BP 116/75 (BP Location: Left Arm)   Pulse 88   Temp 98.5 ?F (36.9 ?C) (Oral)   Resp 16   Ht 5' 2.91" (1.598 m)   Wt 148 lb (67.1 kg)   SpO2 98%   BMI 26.29 kg/m?  ? ?Visual Acuity ?Right Eye Distance:   ?Left Eye Distance:   ?Bilateral Distance:   ? ?Right Eye Near:   ?Left Eye Near:    ?Bilateral Near:    ? ?Physical Exam ?Vitals and nursing note reviewed.  ?HENT:  ?   Head:  ?   Comments: Her upper lids are swollen ?   Right Ear: External ear normal.  ?   Left Ear: External ear normal.  ?Eyes:  ?   General: No scleral icterus. ?   Conjunctiva/sclera: Conjunctivae normal.  ?Pulmonary:  ?   Effort: Pulmonary effort is normal.  ?Abdominal:  ?   General: Bowel sounds are normal.  ?   Palpations: Abdomen is soft.  ?   Comments: Has mild spleenomegally and is tender. No hepatomegally noted, and only mild tenderness on RUQ noted.   ?Musculoskeletal:  ?   Cervical back: Neck supple.  ?Neurological:  ?   Mental Status: She is alert and oriented to person, place, and time.  ?   Gait: Gait normal.  ?Psychiatric:     ?   Mood and Affect: Mood normal.     ?   Behavior: Behavior normal.     ?   Thought Content: Thought content normal.     ?   Judgment: Judgment normal.  ? ? ? ?UC Treatments / Results  ?Labs ?(all labs ordered are listed, but only abnormal results are displayed) ?Labs Reviewed  ?URINALYSIS, ROUTINE W REFLEX MICROSCOPIC - Abnormal; Notable for the following components:  ?    Result Value  ? APPearance HAZY (*)   ? Hgb urine dipstick TRACE (*)   ? Ketones, ur TRACE (*)   ? Leukocytes,Ua TRACE (*)   ? All other components  within normal limits  ?URINALYSIS, MICROSCOPIC (REFLEX) - Abnormal; Notable for the following components:  ? Bacteria, UA FEW (*)   ? All other components within normal limits  ?CBC WITH DIFFERENTIAL/PLATELET - Abno

## 2021-07-01 ENCOUNTER — Other Ambulatory Visit: Payer: Self-pay

## 2021-07-01 MED FILL — Norethindrone & Ethinyl Estradiol Tab 1 MG-35 MCG: ORAL | 28 days supply | Qty: 28 | Fill #7 | Status: AC

## 2021-07-29 ENCOUNTER — Encounter: Payer: Self-pay | Admitting: Internal Medicine

## 2021-08-01 ENCOUNTER — Other Ambulatory Visit: Payer: Self-pay

## 2021-08-01 MED FILL — Norethindrone & Ethinyl Estradiol Tab 1 MG-35 MCG: ORAL | 28 days supply | Qty: 28 | Fill #8 | Status: AC

## 2021-08-02 ENCOUNTER — Other Ambulatory Visit: Payer: Self-pay

## 2021-08-29 ENCOUNTER — Other Ambulatory Visit: Payer: Self-pay

## 2021-08-29 MED FILL — Norethindrone & Ethinyl Estradiol Tab 1 MG-35 MCG: ORAL | 28 days supply | Qty: 28 | Fill #9 | Status: AC

## 2021-09-30 ENCOUNTER — Other Ambulatory Visit: Payer: Self-pay

## 2021-09-30 MED FILL — Norethindrone & Ethinyl Estradiol Tab 1 MG-35 MCG: ORAL | 28 days supply | Qty: 28 | Fill #10 | Status: AC

## 2021-10-03 ENCOUNTER — Encounter: Payer: Self-pay | Admitting: *Deleted

## 2021-10-03 ENCOUNTER — Other Ambulatory Visit: Payer: Self-pay

## 2021-10-03 ENCOUNTER — Ambulatory Visit
Admission: EM | Admit: 2021-10-03 | Discharge: 2021-10-03 | Disposition: A | Discharge status: Home / Self Care | Payer: BC Managed Care – PPO | Attending: Family Medicine | Admitting: Family Medicine

## 2021-10-03 DIAGNOSIS — J029 Acute pharyngitis, unspecified: Secondary | ICD-10-CM | POA: Insufficient documentation

## 2021-10-03 DIAGNOSIS — Z20822 Contact with and (suspected) exposure to covid-19: Secondary | ICD-10-CM | POA: Insufficient documentation

## 2021-10-03 DIAGNOSIS — J069 Acute upper respiratory infection, unspecified: Secondary | ICD-10-CM | POA: Insufficient documentation

## 2021-10-03 LAB — RESP PANEL BY RT-PCR (FLU A&B, COVID) ARPGX2
Influenza A by PCR: NEGATIVE
Influenza B by PCR: NEGATIVE
SARS Coronavirus 2 by RT PCR: NEGATIVE

## 2021-10-03 MED ORDER — AMOXICILLIN-POT CLAVULANATE 875-125 MG PO TABS
1.0000 | ORAL_TABLET | Freq: Two times a day (BID) | ORAL | 0 refills | Status: DC
  Filled 2021-10-03: qty 20, 10d supply, fill #0

## 2021-10-03 MED ORDER — FLUCONAZOLE 150 MG PO TABS
150.0000 mg | ORAL_TABLET | ORAL | 0 refills | Status: DC | PRN
  Filled 2021-10-03: qty 2, 6d supply, fill #0

## 2021-10-03 NOTE — ED Provider Notes (Signed)
[facility]    CSN: [id number] Arrival date & time: 10/03/21  1004      History   Chief Complaint Chief Complaint  Patient presents with  . Sore Throat  . Facial Pain  . Nasal Congestion    HPI Andrea Stevens is a 21 y.o. female.   HPI Patient presents today with a 10 days history of sore throat, nasal congestion, facial pressure,.   Past Medical History:  Diagnosis Date  . Allergy   . Anorexia    age 89 yo as of 21 y.o not active   . Chronic headaches   . Depression, major, single episode   . Eating disorder 2015   resolved.  . Migraines   . Mononucleosis    in 2023  . PONV (postoperative nausea and vomiting)   . UTI (urinary tract infection)     Patient Active Problem List   Diagnosis Date Noted  . B12 deficiency 02/26/2020  . Depression, recurrent ([place]) 08/06/2019  . Anxiety 08/06/2019  . DUB (dysfunctional uterine bleeding) 08/06/2019  . Fatigue 12/10/2018  . Annual physical exam 12/10/2018  . Iron deficiency 12/10/2018  . Menorrhagia with regular cycle 12/10/2018  . Acne 12/10/2018  . Vitamin D deficiency 12/10/2018  . Tailor's bunion of left foot 04/17/2018  . Anxiety and depression 05/04/2017  . Ganglion cyst of dorsum of left wrist 02/26/2017  . Non-suicidal self harm 04/09/2015  . Single current episode of major depressive disorder 04/09/2015  . Acute pain of left wrist 02/11/2015  . Injury of left elbow 02/11/2015  . Eating disorder 08/27/2013  . ALLERGIC RHINITIS CAUSE UNSPECIFIED 04/18/2007  . Constipation 04/18/2007  . HEADACHE 04/18/2007    Past Surgical History:  Procedure Laterality Date  . FOOT SURGERY     tailors bunion 2019  . GANGLION CYST EXCISION Left 07/30/2018   Procedure: REMOVAL GANGLION OF LEFT DORSAL WRIST;  Surgeon: Leanora Cover, MD;  Location: [facility];  Service: Orthopedics;  Laterality: Left;  bier block  . TONSILLECTOMY     2008  . TONSILLECTOMY AND ADENOIDECTOMY    . TYMPANOSTOMY TUBE  PLACEMENT    . WISDOM TOOTH EXTRACTION      OB History     Gravida  0   Para  0   Term  0   Preterm  0   AB  0   Living  0      SAB  0   IAB  0   Ectopic  0   Multiple  0   Live Births  0            Home Medications    Prior to Admission medications   Medication Sig Start Date End Date Taking? Authorizing Provider  amoxicillin-clavulanate (AUGMENTIN) 875-125 MG tablet Take 1 tablet by mouth 2 (two) times daily. With food 02/24/21   McLean-Scocuzza, Nino Glow, MD  amoxicillin-clavulanate (AUGMENTIN) 875-125 MG tablet Take 1 tablet by mouth 2 (two) times daily. X 7-10 days With food 06/10/21   McLean-Scocuzza, Nino Glow, MD  buPROPion (WELLBUTRIN XL) 150 MG 24 hr tablet TAKE 1 TABLET BY MOUTH DAILY. 02/16/21 02/16/22  McLean-Scocuzza, Nino Glow, MD  cholecalciferol (VITAMIN D3) 25 MCG (1000 UNIT) tablet Take 1,000 Units by mouth daily.    [provider]  Ferrous Sulfate (IRON PO) Take by mouth.    [provider]  FLUoxetine (PROZAC) 20 MG capsule TAKE 1 CAPSULE BY MOUTH DAILY. 02/16/21 02/16/22  McLean-Scocuzza, Nino Glow, MD  fluticasone (FLONASE) 50 MCG/ACT nasal spray Place into both nostrils daily.    [provider]  Levonorgestrel ([name]) 19.5 MG IUD by Intrauterine route.    [provider]  norethindrone-ethinyl estradiol 1/35 (ALAYCEN 1/35) tablet TAKE 1 TABLET BY MOUTH DAILY 12/10/20   Anyanwu, Sallyanne Havers, MD  ondansetron (ZOFRAN) 4 MG tablet Take 1 tablet (4 mg total) by mouth every 8 (eight) hours as needed. 06/10/21   McLean-Scocuzza, Nino Glow, MD  phenazopyridine (PYRIDIUM) 200 MG tablet Take 1 tablet (200 mg total) by mouth 3 (three) times daily as needed for pain. 02/24/21   McLean-Scocuzza, Nino Glow, MD  tretinoin (RETIN-A) 0.025 % cream  01/23/19   [provider]  vitamin B-12 (CYANOCOBALAMIN) 500 MCG tablet Take 500 mcg by mouth daily.    [provider]  Norethindrone Acetate-Ethinyl Estrad-FE (LOESTRIN 24  FE) 1-20 MG-MCG(24) tablet Take 1 tablet by mouth daily. Take hormonal tablets only in continuous fashion 10/23/19 01/13/20  Anyanwu, Sallyanne Havers, MD    Family History Family History  Problem Relation Age of Onset  . Diabetes Mother        type 1  . Heart disease Mother   . Arthritis Mother   . Learning disabilities Mother   . Miscarriages / [place] Mother   . Allergies Father   . Kidney disease Father   . Hypertension Maternal Grandmother   . Anxiety disorder Maternal Grandmother   . Hyperlipidemia Maternal Grandmother   . Mental illness Maternal Grandmother   . Hypertension Maternal Grandfather   . Arthritis Maternal Grandfather   . Drug abuse Maternal Grandfather   . Hearing loss Maternal Grandfather   . Heart disease Maternal Grandfather   . Hyperlipidemia Maternal Grandfather   . Heart disease Paternal Grandmother   . Arthritis Paternal Grandmother   . Hypertension Paternal Grandmother   . Cancer Paternal Grandfather        skin melanoma    Social History Social History   Tobacco Use  . Smoking status: Never  . Smokeless tobacco: Never  Vaping Use  . Vaping Use: Never used  Substance Use Topics  . Alcohol use: No  . Drug use: No     Allergies   Fentanyl and Versed [midazolam]   Review of Systems Review of Systems   Physical Exam Triage Vital Signs ED Triage Vitals  Enc Vitals Group     BP 10/03/21 1047 111/75     Pulse Rate 10/03/21 1047 88     Resp 10/03/21 1047 18     Temp 10/03/21 1047 99 F (37.2 C)     Temp src --      SpO2 10/03/21 1047 98 %     Weight --      Height --      Head Circumference --      Peak Flow --      Pain Score 10/03/21 1048 8     Pain Loc --      Pain Edu? --      Excl. in [place]? --    No data found.  Updated Vital Signs BP 111/75   Pulse 88   Temp 99 F (37.2 C)   Resp 18   LMP 09/20/2021   SpO2 98%   Visual Acuity Right Eye Distance:   Left Eye Distance:   Bilateral Distance:    Right Eye Near:    Left Eye Near:    Bilateral Near:     Physical Exam   UC  Treatments / Results  Labs (all labs ordered are listed, but only abnormal results are displayed) Labs Reviewed  RESP PANEL BY RT-PCR (FLU A&B, COVID) ARPGX2  POCT RAPID STREP A (OFFICE)    EKG   Radiology No results found.  Procedures Procedures (including critical care time)  Medications Ordered in UC Medications - No data to display  Initial Impression / Assessment and Plan / UC Course  I have reviewed the triage vital signs and the nursing notes.  Pertinent labs & imaging results that were available during my care of the patient were reviewed by me and considered in my medical decision making (see chart for details).     *** Final Clinical Impressions(s) / UC Diagnoses   Final diagnoses:  Sore throat  Acute upper respiratory infection   Discharge Instructions   None    ED Prescriptions   None    PDMP not reviewed this encounter.

## 2021-10-03 NOTE — Discharge Instructions (Signed)
I'm treating you for a suspected sinus infection. Continue Flonase and any other symptom medications while taking antibiotics,  COVID- 19 /Flu test results will be available within 12-24 hours. Negative results are immediately resulted to Mychart. Positive results will receive a follow-up call from our clinic. If symptoms are present, I recommend home quarantine until results are known.

## 2021-10-03 NOTE — ED Triage Notes (Signed)
Pt reports Sx's started 10 days ago. Pt has had facial pain,sore throat. Pt is requesting a COVID test.

## 2021-10-28 ENCOUNTER — Other Ambulatory Visit: Payer: Self-pay

## 2021-10-28 MED FILL — Norethindrone & Ethinyl Estradiol Tab 1 MG-35 MCG: ORAL | 28 days supply | Qty: 28 | Fill #11 | Status: AC

## 2021-11-28 ENCOUNTER — Other Ambulatory Visit: Payer: Self-pay

## 2021-11-28 ENCOUNTER — Other Ambulatory Visit: Payer: Self-pay | Admitting: Obstetrics & Gynecology

## 2021-11-28 DIAGNOSIS — N939 Abnormal uterine and vaginal bleeding, unspecified: Secondary | ICD-10-CM

## 2021-11-28 DIAGNOSIS — N921 Excessive and frequent menstruation with irregular cycle: Secondary | ICD-10-CM

## 2021-11-28 MED ORDER — NYLIA 1/35 1-35 MG-MCG PO TABS
ORAL_TABLET | ORAL | 11 refills | Status: DC
  Filled 2021-11-28: qty 28, 28d supply, fill #0
  Filled 2022-01-09: qty 28, 28d supply, fill #1
  Filled 2022-02-06: qty 28, 28d supply, fill #2
  Filled 2022-03-14: qty 28, 28d supply, fill #3
  Filled 2022-04-19: qty 28, 28d supply, fill #4
  Filled 2022-05-18: qty 28, 28d supply, fill #5

## 2022-01-09 ENCOUNTER — Other Ambulatory Visit: Payer: Self-pay

## 2022-02-06 ENCOUNTER — Other Ambulatory Visit: Payer: Self-pay

## 2022-02-16 ENCOUNTER — Encounter: Payer: BC Managed Care – PPO | Admitting: Internal Medicine

## 2022-02-17 ENCOUNTER — Encounter: Payer: BC Managed Care – PPO | Admitting: Internal Medicine

## 2022-04-19 ENCOUNTER — Other Ambulatory Visit: Payer: Self-pay

## 2022-06-06 ENCOUNTER — Ambulatory Visit
Admission: RE | Admit: 2022-06-06 | Discharge: 2022-06-06 | Disposition: A | Discharge status: Home / Self Care | Payer: BC Managed Care – PPO | Source: Ambulatory Visit | Attending: Internal Medicine | Admitting: Internal Medicine

## 2022-06-06 VITALS — BP 107/71 | HR 71 | Temp 98.2°F | Resp 17

## 2022-06-06 DIAGNOSIS — I889 Nonspecific lymphadenitis, unspecified: Secondary | ICD-10-CM

## 2022-06-06 LAB — CBC WITH DIFFERENTIAL/PLATELET
Abs Immature Granulocytes: 0 10*3/uL (ref 0.00–0.07)
Basophils Absolute: 0 10*3/uL (ref 0.0–0.1)
Basophils Relative: 1 %
Eosinophils Absolute: 0.1 10*3/uL (ref 0.0–0.5)
Eosinophils Relative: 3 %
HCT: 37.3 % (ref 36.0–46.0)
Hemoglobin: 12.4 g/dL (ref 12.0–15.0)
Immature Granulocytes: 0 %
Lymphocytes Relative: 48 %
Lymphs Abs: 1.7 10*3/uL (ref 0.7–4.0)
MCH: 28.3 pg (ref 26.0–34.0)
MCHC: 33.2 g/dL (ref 30.0–36.0)
MCV: 85.2 fL (ref 80.0–100.0)
Monocytes Absolute: 0.5 10*3/uL (ref 0.1–1.0)
Monocytes Relative: 13 %
Neutro Abs: 1.2 10*3/uL — ABNORMAL LOW (ref 1.7–7.7)
Neutrophils Relative %: 35 %
Platelets: 239 10*3/uL (ref 150–400)
RBC: 4.38 MIL/uL (ref 3.87–5.11)
RDW: 12.1 % (ref 11.5–15.5)
Smear Review: NORMAL
WBC Morphology: ABNORMAL
WBC: 3.5 10*3/uL — ABNORMAL LOW (ref 4.0–10.5)
nRBC: 0 % (ref 0.0–0.2)

## 2022-06-06 NOTE — ED Triage Notes (Signed)
Pt c/o swollen lymph nodes, headache, sore throat   x 5 days,   Pt has been using otc ibuprofen which has helped with discomfort but not swelling.

## 2022-06-06 NOTE — ED Provider Notes (Addendum)
[facility]    CSN: [id number] Arrival date & time: 06/06/22  0935      History   Chief Complaint Chief Complaint  Patient presents with   Generalized Body Aches    Bilateral swollen neck lymph nodes - Entered by patient   Adenopathy    HPI Andrea Stevens is a 22 y.o. female who has developed shotty nodes on the back of her head and posterior neck x 3 days. Denies ST, ear pain, rashes, rhinitis, cough fever, or chills. Has had a HA on occipital area. Has had night sweats the past 3 nights, but none during the day. Denies fatigue like when she had mono last year. Does have 5 cats, but has not been scratched. She also has a small node on the R groin region.     Past Medical History:  Diagnosis Date   Allergy    Anorexia    age 28 yo as of 22 y.o not active    Chronic headaches    Depression, major, single episode    Eating disorder 2015   resolved.   Migraines    Mononucleosis    in 2023   PONV (postoperative nausea and vomiting)    UTI (urinary tract infection)     Patient Active Problem List   Diagnosis Date Noted   B12 deficiency 02/26/2020   Depression, recurrent 08/06/2019   Anxiety 08/06/2019   DUB (dysfunctional uterine bleeding) 08/06/2019   Fatigue 12/10/2018   Annual physical exam 12/10/2018   Iron deficiency 12/10/2018   Menorrhagia with regular cycle 12/10/2018   Acne 12/10/2018   Vitamin D deficiency 12/10/2018   Tailor's bunion of left foot 04/17/2018   Anxiety and depression 05/04/2017   Ganglion cyst of dorsum of left wrist 02/26/2017   Non-suicidal self harm 04/09/2015   Single current episode of major depressive disorder 04/09/2015   Acute pain of left wrist 02/11/2015   Injury of left elbow 02/11/2015   Eating disorder 08/27/2013   ALLERGIC RHINITIS CAUSE UNSPECIFIED 04/18/2007   Constipation 04/18/2007   HEADACHE 04/18/2007    Past Surgical History:  Procedure Laterality Date   FOOT SURGERY     tailors bunion 2019    GANGLION CYST EXCISION Left 07/30/2018   Procedure: REMOVAL GANGLION OF LEFT DORSAL WRIST;  Surgeon: Betha Loa, MD;  Location: [facility];  Service: Orthopedics;  Laterality: Left;  bier block   TONSILLECTOMY     2008   TONSILLECTOMY AND ADENOIDECTOMY     TYMPANOSTOMY TUBE PLACEMENT     WISDOM TOOTH EXTRACTION      OB History     Gravida  0   Para  0   Term  0   Preterm  0   AB  0   Living  0      SAB  0   IAB  0   Ectopic  0   Multiple  0   Live Births  0            Home Medications    Prior to Admission medications   Medication Sig Start Date End Date Taking? Authorizing Provider  amoxicillin-clavulanate (AUGMENTIN) 875-125 MG tablet Take 1 tablet by mouth 2 (two) times daily. 10/03/21   Bing Neighbors, NP  buPROPion (WELLBUTRIN XL) 150 MG 24 hr tablet TAKE 1 TABLET BY MOUTH DAILY. 02/16/21 02/16/22  McLean-Scocuzza, Pasty Spillers, MD  cholecalciferol (VITAMIN D3) 25 MCG (1000 UNIT) tablet Take 1,000 Units by mouth daily.  [provider]  Ferrous Sulfate (IRON PO) Take by mouth.    [provider]  fluconazole (DIFLUCAN) 150 MG tablet Take 1 tablet (150 mg total) by mouth every three (3) days as needed (vaginitis symptoms). Repeat if needed 10/03/21   Bing Neighbors, NP  FLUoxetine (PROZAC) 20 MG capsule TAKE 1 CAPSULE BY MOUTH DAILY. 02/16/21 02/16/22  McLean-Scocuzza, Pasty Spillers, MD  fluticasone (FLONASE) 50 MCG/ACT nasal spray Place into both nostrils daily.    [provider]  Levonorgestrel ([name]) 19.5 MG IUD by Intrauterine route.    [provider]  norethindrone-ethinyl estradiol 1/35 ([name] 1/35) tablet TAKE 1 TABLET BY MOUTH DAILY 11/28/21   Anyanwu, Jethro Bastos, MD  ondansetron (ZOFRAN) 4 MG tablet Take 1 tablet (4 mg total) by mouth every 8 (eight) hours as needed. 06/10/21   McLean-Scocuzza, Pasty Spillers, MD  phenazopyridine (PYRIDIUM) 200 MG tablet Take 1 tablet (200 mg total) by mouth 3 (three) times  daily as needed for pain. 02/24/21   McLean-Scocuzza, Pasty Spillers, MD  tretinoin (RETIN-A) 0.025 % cream  01/23/19   [provider]  vitamin B-12 (CYANOCOBALAMIN) 500 MCG tablet Take 500 mcg by mouth daily.    [provider]  Norethindrone Acetate-Ethinyl Estrad-FE (LOESTRIN 24 FE) 1-20 MG-MCG(24) tablet Take 1 tablet by mouth daily. Take hormonal tablets only in continuous fashion 10/23/19 01/13/20  Anyanwu, Jethro Bastos, MD    Family History Family History  Problem Relation Age of Onset   Diabetes Mother        type 1   Heart disease Mother    Arthritis Mother    Learning disabilities Mother    Miscarriages / [place] Mother    Allergies Father    Kidney disease Father    Hypertension Maternal Grandmother    Anxiety disorder Maternal Grandmother    Hyperlipidemia Maternal Grandmother    Mental illness Maternal Grandmother    Hypertension Maternal Grandfather    Arthritis Maternal Grandfather    Drug abuse Maternal Grandfather    Hearing loss Maternal Grandfather    Heart disease Maternal Grandfather    Hyperlipidemia Maternal Grandfather    Heart disease Paternal Grandmother    Arthritis Paternal Grandmother    Hypertension Paternal Grandmother    Cancer Paternal Grandfather        skin melanoma    Social History Social History   Tobacco Use   Smoking status: Never   Smokeless tobacco: Never  Vaping Use   Vaping Use: Never used  Substance Use Topics   Alcohol use: No   Drug use: No     Allergies   Fentanyl and Versed [midazolam]   Review of Systems Review of Systems  Gastrointestinal:  Negative for abdominal pain.  Genitourinary:  Negative for dysuria and vaginal discharge.  And as noted per HPI   Physical Exam Triage Vital Signs ED Triage Vitals  Enc Vitals Group     BP 06/06/22 0956 107/71     Pulse Rate 06/06/22 0956 71     Resp 06/06/22 0956 17     Temp 06/06/22 0956 98.2 F (36.8 C)     Temp Source 06/06/22 0956 Oral     SpO2  06/06/22 0956 96 %     Weight --      Height --      Head Circumference --      Peak Flow --      Pain Score 06/06/22 0959 3     Pain Loc --  Pain Edu? --      Excl. in [place]? --    No data found.  Updated Vital Signs BP 107/71 (BP Location: Left Arm)   Pulse 71   Temp 98.2 F (36.8 C) (Oral)   Resp 17   LMP 05/31/2022 (Within Days)   SpO2 96%   Visual Acuity Right Eye Distance:   Left Eye Distance:   Bilateral Distance:    Right Eye Near:   Left Eye Near:    Bilateral Near:      Physical Exam Vitals signs and nursing note reviewed.  Constitutional:      General: She is not in acute distress.    Appearance: Normal appearance. She is not ill-appearing, toxic-appearing or diaphoretic.  HENT:     Head: Normocephalic.     Right Ear: Tympanic membrane, ear canal and external ear normal.     Left Ear: Tympanic membrane, ear canal and external ear normal.     Nose: Nose normal.     Mouth/Throat: clear, tonsils are absent     Mouth: Mucous membranes are moist.  Eyes:     General: No scleral icterus.       Right eye: No discharge.        Left eye: No discharge.     Conjunctiva/sclera: Conjunctivae normal.  Neck:     Musculoskeletal: Neck supple. No neck rigidity.  Cardiovascular:     Rate and Rhythm: Normal rate and regular rhythm.     Heart sounds: No murmur.  Pulmonary:     Effort: Pulmonary effort is normal.     Breath sounds: Normal breath sounds.  Abdominal:     R inguinal region with 1/2 pea size mobile node, non tender  Musculoskeletal: Normal range of motion.  Lymphadenopathy: Has pea size or smaller shotty nodes on both occipital and posterior cervical chain. Has on one 1 cm x 1 cm L post auricular region and 1/2 cm x 1/2 cm on R preauricular region. All are mobile. The mid posterior R chain and both occipital ones are a little tender    No subclavian and supraclavicular or axillary nodes present   Skin:    General: Skin is warm and dry.     Coloration:  Skin is not jaundiced.     Findings: No rash.  Neurological:     Mental Status: She is alert and oriented to person, place, and time.     Gait: Gait normal.  Psychiatric:        Mood and Affect: Mood normal.        Behavior: Behavior normal.        Thought Content: Thought content normal.        Judgment: Judgment normal.    UC Treatments / Results  Labs (all labs ordered are listed, but only abnormal results are displayed) Labs Reviewed  CBC WITH DIFFERENTIAL/PLATELET - Abnormal; Notable for the following components:      Result Value   WBC 3.5 (*)    Neutro Abs 1.2 (*)    All other components within normal limits    EKG   Radiology No results found.  Procedures Procedures (including critical care time)  Medications Ordered in UC Medications - No data to display  Initial Impression / Assessment and Plan / UC Course  I have reviewed the triage vital signs and the nursing notes.  Pertinent labs results that were available during my care of the patient were reviewed by me and considered in my medical  decision making (see chart for details).  Lymphadenopathy Leukopenia  CBC with diff was ordered. I called pt about her CBC with diff results and she was informed of her results, and have it repeated with PCP in one week and from there they can decide if she needs a peripheral smear done.    Final Clinical Impressions(s) / UC Diagnoses  Lymphadenopathy Leukopenia  CBC with diff was ordered. I called pt about her CBC with diff results and she was informed of her results, and have it repeated with PCP in one week and from there they can decide if she needs a peripheral smear done.  Final diagnoses:  Lymphadenitis   Discharge Instructions   None    ED Prescriptions   None    PDMP not reviewed this encounter.   Garey Ham, PA-C 06/06/22 1249    Rodriguez-Southworth, [place], PA-C 06/06/22 1250    Rodriguez-Southworth, [place],  PA-C 06/06/22 1251

## 2022-06-12 ENCOUNTER — Encounter: Payer: Self-pay | Admitting: Nurse Practitioner

## 2022-06-12 ENCOUNTER — Ambulatory Visit: Payer: BC Managed Care – PPO | Admitting: Nurse Practitioner

## 2022-06-12 VITALS — BP 122/76 | HR 88 | Temp 98.1°F | Ht 62.91 in | Wt 137.6 lb

## 2022-06-12 DIAGNOSIS — M2550 Pain in unspecified joint: Secondary | ICD-10-CM | POA: Diagnosis not present

## 2022-06-12 DIAGNOSIS — R591 Generalized enlarged lymph nodes: Secondary | ICD-10-CM

## 2022-06-12 DIAGNOSIS — D72819 Decreased white blood cell count, unspecified: Secondary | ICD-10-CM

## 2022-06-12 DIAGNOSIS — R590 Localized enlarged lymph nodes: Secondary | ICD-10-CM | POA: Diagnosis not present

## 2022-06-12 NOTE — Assessment & Plan Note (Signed)
CBC with peripheral smear.  Will compare to previous if lipids do not start getting better over the next 7 to 10 days refer to ENT

## 2022-06-12 NOTE — Progress Notes (Signed)
Acute Office Visit  Subjective:     Patient ID: Andrea Stevens, female    DOB: 2000-12-15, 22 y.o.   MRN: [id number]  Chief Complaint  Patient presents with   Adenopathy    Swollen lymph nodes at UC. Discuss lab work.    HPI Patient is in today for UC follow up  Patient was seen in urgent care on 06/06/2022 diagnosed with lymphadenitis with a CBC drawn.  She is below white blood cell count with absolute neutrophils lower side.  She was recommended to follow-up primary care to repeat CBC.  States that she feels fine. States that some of the lymphonodes have been swollen and some have improved. States that she did feel that she had some pressure in the chest that make her think that the chest is swollen. States that she may have had a fever. States that she has woken up in the past month over the past month. States that it happened 3 weeks ago and has not happened since.   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  No exposure outside of her norm mistakes.  Patient does have a history of mild every similar symptoms.  Patient states her eyelids feel swollen along with her spleen feeling swollen.  States she does feel like she has a little nausea to go along with it.  States that she has noticed some lymph nodes go down but the others have been persistent and some still having tenderness.  Does have 5 cats but no scratch that he is aware of is sexually active but no exposure to STIs that she is aware.  No history of unprotected sex per patient report.   States that she has had a history of joint pains. States that her mother does have a sero negative RA  Review of Systems  Constitutional:  Negative for chills and fever.  Respiratory:  Negative for shortness of breath.   Cardiovascular:  Negative for chest pain.  Skin:        "+" lymphadenopathy  Psychiatric/Behavioral:  Negative for hallucinations and suicidal ideas.          Objective:    BP 122/76 (BP Location: Left Arm)   Pulse 88   Temp 98.1 F (36.7 C) (Temporal)   Ht 5' 2.91" (1.598 m)   Wt 137 lb 9.6 oz (62.4 kg)   LMP 05/31/2022 (Within Days)   SpO2 99%   BMI 24.44 kg/m  BP Readings from Last 3 Encounters:  06/12/22 122/76  06/06/22 107/71  10/03/21 111/75   Wt Readings from Last 3 Encounters:  06/12/22 137 lb 9.6 oz (62.4 kg)  06/16/21 148 lb (67.1 kg)  06/10/21 148 lb (67.1 kg)      Physical Exam Vitals and nursing note reviewed.  Constitutional:      Appearance: Normal appearance.  Neck:   Cardiovascular:     Rate and Rhythm: Normal rate and regular rhythm.     Heart sounds: Normal heart sounds.  Pulmonary:     Effort: Pulmonary effort is normal.     Breath sounds: Normal breath sounds.  Abdominal:    Lymphadenopathy:     Head:     Right side of head: Occipital adenopathy present.     Left side of head: Preauricular adenopathy present.     Cervical: Cervical adenopathy present.     Right cervical: Superficial cervical adenopathy present.     Left  cervical: Posterior cervical adenopathy present.     Upper Body:     Right upper body: No supraclavicular adenopathy.     Left upper body: No supraclavicular adenopathy.     Lower Body: Right inguinal adenopathy present. No left inguinal adenopathy.  Neurological:     Mental Status: She is alert.     No results found for any visits on 06/12/22.      Assessment & Plan:   Problem List Items Addressed This Visit       Immune and Lymphatic   Cervical lymphadenopathy - Primary    CBC with peripheral smear.  Will compare to previous if lipids do not start getting better over the next 7 to 10 days refer to ENT      Relevant Orders   HIV Antibody (routine testing w rflx)   CBC with Differential/Platelet   Lymphadenopathy    Has some inguinal lymphadenopathy.  No exposure to STIs per patient report will run HIV panel      Relevant Orders   HIV Antibody (routine  testing w rflx)     Other   Multiple joint pain    Patient has history of multiple joint pains.  States she was evaluated for juvenile arthritis but was negative.  Her mother does have a history of seronegative RA with treatment      Relevant Orders   Rheumatoid factor   ANA   Leukopenia    CBC with differential and peripheral smear.  Pending result      Relevant Orders   Pathologist smear review   CBC with Differential/Platelet    No orders of the defined types were placed in this encounter.   Return if symptoms worsen or fail to improve, for As scheduled .  Audria Nine, NP

## 2022-06-12 NOTE — Assessment & Plan Note (Signed)
Has some inguinal lymphadenopathy.  No exposure to STIs per patient report will run HIV panel

## 2022-06-12 NOTE — Assessment & Plan Note (Signed)
Patient has history of multiple joint pains.  States she was evaluated for juvenile arthritis but was negative.  Her mother does have a history of seronegative RA with treatment

## 2022-06-12 NOTE — Assessment & Plan Note (Signed)
CBC with differential and peripheral smear.  Pending result

## 2022-06-12 NOTE — Patient Instructions (Addendum)
Nice to see you today Make a lab appointment when you leave.   Keep your appointment as scheduled

## 2022-06-13 ENCOUNTER — Other Ambulatory Visit (INDEPENDENT_AMBULATORY_CARE_PROVIDER_SITE_OTHER): Payer: BC Managed Care – PPO

## 2022-06-13 DIAGNOSIS — D72819 Decreased white blood cell count, unspecified: Secondary | ICD-10-CM

## 2022-06-13 DIAGNOSIS — M2550 Pain in unspecified joint: Secondary | ICD-10-CM | POA: Diagnosis not present

## 2022-06-13 DIAGNOSIS — R591 Generalized enlarged lymph nodes: Secondary | ICD-10-CM | POA: Diagnosis not present

## 2022-06-13 DIAGNOSIS — R590 Localized enlarged lymph nodes: Secondary | ICD-10-CM | POA: Diagnosis not present

## 2022-06-13 NOTE — Addendum Note (Signed)
Addended by: Eual Fines on: 06/13/2022 03:33 PM   Modules accepted: Orders

## 2022-06-14 ENCOUNTER — Encounter: Payer: Self-pay | Admitting: Nurse Practitioner

## 2022-06-14 LAB — CBC WITH DIFFERENTIAL/PLATELET
Basophils Absolute: 0.1 10*3/uL (ref 0.0–0.2)
Basos: 1 %
EOS (ABSOLUTE): 0.1 10*3/uL (ref 0.0–0.4)
Eos: 3 %
Hematocrit: 37.2 % (ref 34.0–46.6)
Hemoglobin: 12.4 g/dL (ref 11.1–15.9)
Immature Grans (Abs): 0 10*3/uL (ref 0.0–0.1)
Immature Granulocytes: 1 %
Lymphocytes Absolute: 1.9 10*3/uL (ref 0.7–3.1)
Lymphs: 40 %
MCH: 27.8 pg (ref 26.6–33.0)
MCHC: 33.3 g/dL (ref 31.5–35.7)
MCV: 83 fL (ref 79–97)
Monocytes Absolute: 0.4 10*3/uL (ref 0.1–0.9)
Monocytes: 8 %
Neutrophils Absolute: 2.2 10*3/uL (ref 1.4–7.0)
Neutrophils: 47 %
Platelets: 396 10*3/uL (ref 150–450)
RBC: 4.46 x10E6/uL (ref 3.77–5.28)
RDW: 11.8 % (ref 11.7–15.4)
WBC: 4.7 10*3/uL (ref 3.4–10.8)

## 2022-06-14 LAB — RHEUMATOID FACTOR: Rheumatoid fact SerPl-aCnc: <10 IU/mL (ref <14.0)

## 2022-06-14 LAB — ANA: Anti Nuclear Antibody (ANA): NEGATIVE

## 2022-06-14 LAB — HIV ANTIBODY (ROUTINE TESTING W REFLEX): HIV Screen 4th Generation wRfx: NONREACTIVE

## 2022-06-16 LAB — PATHOLOGIST SMEAR REVIEW
Basophils Absolute: 0.1 10*3/uL (ref 0.0–0.2)
Basos: 1 %
EOS (ABSOLUTE): 0.1 10*3/uL (ref 0.0–0.4)
Eos: 3 %
Hematocrit: 38.7 % (ref 34.0–46.6)
Hemoglobin: 12.8 g/dL (ref 11.1–15.9)
Immature Grans (Abs): 0 10*3/uL (ref 0.0–0.1)
Immature Granulocytes: 1 %
Lymphocytes Absolute: 1.9 10*3/uL (ref 0.7–3.1)
Lymphs: 41 %
MCH: 27.8 pg (ref 26.6–33.0)
MCHC: 33.1 g/dL (ref 31.5–35.7)
MCV: 84 fL (ref 79–97)
Monocytes Absolute: 0.4 10*3/uL (ref 0.1–0.9)
Monocytes: 8 %
Neutrophils Absolute: 2.1 10*3/uL (ref 1.4–7.0)
Neutrophils: 46 %
Platelets: 382 10*3/uL (ref 150–450)
RBC: 4.61 x10E6/uL (ref 3.77–5.28)
RDW: 11.7 % (ref 11.7–15.4)
WBC: 4.5 10*3/uL (ref 3.4–10.8)

## 2022-06-19 ENCOUNTER — Encounter: Payer: Self-pay | Admitting: Nurse Practitioner

## 2022-06-19 DIAGNOSIS — R591 Generalized enlarged lymph nodes: Secondary | ICD-10-CM

## 2022-06-21 NOTE — Addendum Note (Signed)
Addended by: Eden Emms on: 06/21/2022 07:22 AM   Modules accepted: Orders

## 2022-06-30 NOTE — Telephone Encounter (Addendum)
Patient's mom calling re: ENT referral. Tried to call to schedule, but she is being told that ENT [facility] is not able to see the referral.  Need someone to assist, or if there are other options, please advise.

## 2022-07-04 ENCOUNTER — Ambulatory Visit: Payer: BC Managed Care – PPO | Admitting: Nurse Practitioner

## 2022-07-04 ENCOUNTER — Encounter: Payer: Self-pay | Admitting: Nurse Practitioner

## 2022-07-04 ENCOUNTER — Other Ambulatory Visit: Payer: Self-pay

## 2022-07-04 ENCOUNTER — Telehealth: Payer: Self-pay | Admitting: Nurse Practitioner

## 2022-07-04 VITALS — BP 108/60 | HR 72 | Temp 98.5°F | Resp 16 | Ht 62.91 in | Wt 140.2 lb

## 2022-07-04 DIAGNOSIS — R591 Generalized enlarged lymph nodes: Secondary | ICD-10-CM | POA: Diagnosis not present

## 2022-07-04 DIAGNOSIS — N939 Abnormal uterine and vaginal bleeding, unspecified: Secondary | ICD-10-CM

## 2022-07-04 DIAGNOSIS — R600 Localized edema: Secondary | ICD-10-CM

## 2022-07-04 DIAGNOSIS — R0602 Shortness of breath: Secondary | ICD-10-CM

## 2022-07-04 DIAGNOSIS — N921 Excessive and frequent menstruation with irregular cycle: Secondary | ICD-10-CM

## 2022-07-04 NOTE — Telephone Encounter (Signed)
Patient's mother contacted the office regarding patient's birth control. Marked as not taking in meds list, also states medication was removed from her meds list by someone at our office, I could find no evidence of this in patient's chart. Patient's mother states her daughter needs this medication refilled and needs it documented that she is still taking this medication. States patient had an acute visit with [facility] today and that she discussed taking this medication again. Please advise mother if needed (okay per DPR), states patient works nights and is asleep during most of the day. Medication may go to [facility]. [phone].

## 2022-07-04 NOTE — Assessment & Plan Note (Signed)
Acute onset over the past 4 days.  It is worse with activity as standing and walking per patient report.  Will do conservative treatment such as elevate legs and compression stockings while awake at work.

## 2022-07-04 NOTE — Progress Notes (Signed)
Acute Office Visit  Subjective:     Patient ID: Andrea Stevens, female    DOB: 03-May-2000, 22 y.o.   MRN: [id number]  Chief Complaint  Patient presents with   Leg Swelling    X 4 days ago     HPI Patient is in today for leg swelling with a history of lymph adenopathy, iron def, anxiety/depression, Vitamin B12 def, vitamin D def, DUB  States that she was seen by me on 06/12/2022. States that she noticed that her knee started bothering her of Friday. States that on Saturday it was ok.  But she was seated for approximately 6 to 7 hours doing her [employer] exam. states her sisters boyfriend graduated on Sunday and she had to stand and walk a lot. States that she also worked  and it hurt then  The pain descried as a tightness. Worse with movement and standing up  Elevated it that has helped. States that the took ibuprofen did help with the pain and not with the swelling.  Patient history of DVT.  Patient is on exogenous hormones but denies recent travel 4 or more hours of without a break.  No recent surgeries.    Review of Systems  Constitutional:  Negative for chills and fever.  Respiratory:  Positive for shortness of breath (previous).   Cardiovascular:  Positive for chest pain (previous) and leg swelling.  Musculoskeletal:  Positive for joint pain.        Objective:    BP 108/60   Pulse 72   Temp 98.5 F (36.9 C)   Resp 16   Ht 5' 2.91" (1.598 m)   Wt 140 lb 4 oz (63.6 kg)   LMP 06/27/2022 (Within Days)   SpO2 98%   BMI 24.92 kg/m  BP Readings from Last 3 Encounters:  07/04/22 108/60  06/12/22 122/76  06/06/22 107/71   Wt Readings from Last 3 Encounters:  07/04/22 140 lb 4 oz (63.6 kg)  06/12/22 137 lb 9.6 oz (62.4 kg)  06/16/21 148 lb (67.1 kg)      Physical Exam Constitutional:      Appearance: Normal appearance.  Cardiovascular:     Rate and Rhythm: Normal rate.     Pulses:          Dorsalis pedis pulses are 2+ on the right side and 2+ on the left side.      Comments: L>R Pulmonary:     Effort: Pulmonary effort is normal.  Musculoskeletal:     Right lower leg: 1+ Pitting Edema present.     Left lower leg: 2+ Pitting Edema present.  Skin:    Comments: 39cm  left side calf 38cm right side calf  Neurological:     Mental Status: She is alert.     No results found for any visits on 07/04/22.      Assessment & Plan:   Problem List Items Addressed This Visit       Immune and Lymphatic   Lymphadenopathy    Patient has an appointment in June with ENT but is worried as I will be over 10 weeks since symptom onset.  She thinks some of her lower extremity edema is due to inadequate with no drainage.  She did have a lymph node in her inguinal area but most of her small lymph nodes were head and neck.  Will change ENT referral to urgent        Other   Bilateral lower extremity edema - Primary  Acute onset over the past 4 days.  It is worse with activity as standing and walking per patient report.  Will do conservative treatment such as elevate legs and compression stockings while awake at work.       No orders of the defined types were placed in this encounter.   Return if symptoms worsen or fail to improve, for As scheduled .  Audria Nine, NP

## 2022-07-04 NOTE — Assessment & Plan Note (Signed)
Patient has an appointment in June with ENT but is worried as I will be over 10 weeks since symptom onset.  She thinks some of her lower extremity edema is due to inadequate with no drainage.  She did have a lymph node in her inguinal area but most of her small lymph nodes were head and neck.  Will change ENT referral to urgent

## 2022-07-04 NOTE — Patient Instructions (Signed)
Nice to see you today Elevate the legs when you can Wear  compression socks while awake, especially at work Keep appt as scheduled

## 2022-07-04 NOTE — Telephone Encounter (Signed)
I am wanting to change the ENT referral to Urgent. Do I need to place a new order or can the priority just be changed

## 2022-07-05 MED ORDER — NORETHIN ACE-ETH ESTRAD-FE 1-20 MG-MCG(24) PO TABS: 1.0000 | ORAL_TABLET | Freq: Every day | ORAL | 5 refills | Status: DC

## 2022-07-05 NOTE — Telephone Encounter (Signed)
Called pt mom and relayed the message.

## 2022-07-05 NOTE — Telephone Encounter (Signed)
This was added back on to the medication list. This is prescribed through her GYN. They will need to reach out to them for a refill

## 2022-07-06 NOTE — Telephone Encounter (Signed)
I placed a stat US of the neck for the patient. She is aware to call and make the appointment. I want her to get the chest xray also at the same time

## 2022-07-07 ENCOUNTER — Ambulatory Visit
Admission: RE | Admit: 2022-07-07 | Discharge: 2022-07-07 | Disposition: A | Discharge status: Home / Self Care | Payer: BC Managed Care – PPO | Source: Ambulatory Visit | Attending: Nurse Practitioner | Admitting: Nurse Practitioner

## 2022-07-07 DIAGNOSIS — R590 Localized enlarged lymph nodes: Secondary | ICD-10-CM | POA: Diagnosis not present

## 2022-07-07 DIAGNOSIS — R0602 Shortness of breath: Secondary | ICD-10-CM

## 2022-07-07 DIAGNOSIS — R591 Generalized enlarged lymph nodes: Secondary | ICD-10-CM

## 2022-07-11 ENCOUNTER — Other Ambulatory Visit: Payer: Self-pay

## 2022-07-11 ENCOUNTER — Other Ambulatory Visit: Payer: Self-pay | Admitting: Obstetrics & Gynecology

## 2022-07-11 DIAGNOSIS — N921 Excessive and frequent menstruation with irregular cycle: Secondary | ICD-10-CM

## 2022-07-11 DIAGNOSIS — N939 Abnormal uterine and vaginal bleeding, unspecified: Secondary | ICD-10-CM

## 2022-07-13 ENCOUNTER — Other Ambulatory Visit: Payer: Self-pay

## 2022-07-24 ENCOUNTER — Telehealth: Payer: Self-pay | Admitting: Nurse Practitioner

## 2022-07-24 DIAGNOSIS — I890 Lymphedema, not elsewhere classified: Secondary | ICD-10-CM | POA: Diagnosis not present

## 2022-07-24 NOTE — Telephone Encounter (Signed)
Pt stated she visited the ENT provider that [facility] referred her to. Pt states was told that her lymph nodes weren't big enough to have a biopsy & that her other symptoms weren't his problem, to f/u with [facility]. Pt asked what would be her next steps? Call back # [phone]

## 2022-07-25 ENCOUNTER — Telehealth: Payer: Self-pay

## 2022-07-25 NOTE — Telephone Encounter (Signed)
Letter sent out [facility]

## 2022-07-25 NOTE — Telephone Encounter (Signed)
Left message to return call to our office.  Need to know what ENT she was going to.

## 2022-07-25 NOTE — Telephone Encounter (Signed)
Patient returned call from the office, says she went to [facility] ent. States they had recommended antibiotics and prednisone to her.

## 2022-07-25 NOTE — Telephone Encounter (Signed)
Letter sent out to [facility] for office notes.

## 2022-07-25 NOTE — Telephone Encounter (Signed)
Spoke to pt and relayed the message to her that once you get the notes from ENT we will reach back out to her.

## 2022-07-25 NOTE — Telephone Encounter (Signed)
I will need to review the ENTs note. Can we call and get a copy faxed to the office and let the patient know that once it is reviewed we will contact her

## 2022-07-25 NOTE — Telephone Encounter (Signed)
Left message to return call to our office.  

## 2022-08-03 ENCOUNTER — Telehealth: Payer: Self-pay | Admitting: Nurse Practitioner

## 2022-08-03 NOTE — Telephone Encounter (Signed)
Called pt and she stated that she is about the same. No change.

## 2022-08-03 NOTE — Telephone Encounter (Signed)
I was able to review Dr. Vincente Poli ENT. Note. I understand that he is not overly concerned for the lymphnodes. He did mention the swelling of the legs which she and I have discussed in the past. Can we get a check on her symptoms and see how see is doing/feeling

## 2022-08-04 ENCOUNTER — Encounter: Payer: Self-pay | Admitting: Nurse Practitioner

## 2022-08-04 NOTE — Telephone Encounter (Signed)
Noted  

## 2022-08-15 ENCOUNTER — Ambulatory Visit: Payer: BC Managed Care – PPO | Admitting: Nurse Practitioner

## 2022-08-15 ENCOUNTER — Encounter: Payer: Self-pay | Admitting: Nurse Practitioner

## 2022-08-15 VITALS — BP 98/50 | HR 75 | Temp 97.8°F | Resp 16 | Ht 62.91 in | Wt 138.5 lb

## 2022-08-15 DIAGNOSIS — R5383 Other fatigue: Secondary | ICD-10-CM

## 2022-08-15 DIAGNOSIS — Z0000 Encounter for general adult medical examination without abnormal findings: Secondary | ICD-10-CM | POA: Diagnosis not present

## 2022-08-15 DIAGNOSIS — F419 Anxiety disorder, unspecified: Secondary | ICD-10-CM | POA: Diagnosis not present

## 2022-08-15 DIAGNOSIS — R0982 Postnasal drip: Secondary | ICD-10-CM | POA: Diagnosis not present

## 2022-08-15 DIAGNOSIS — R590 Localized enlarged lymph nodes: Secondary | ICD-10-CM

## 2022-08-15 DIAGNOSIS — F32A Depression, unspecified: Secondary | ICD-10-CM

## 2022-08-15 NOTE — Assessment & Plan Note (Signed)
Having some yellow mucus going down her throat for several weeks per patient report as patient use Flonase consistently for [redacted] week along with antihistamine.  If still having difficulty we can consider antibiotic treatment after that

## 2022-08-15 NOTE — Progress Notes (Signed)
Established Patient Office Visit  Subjective   Patient ID: Andrea Stevens, female    DOB: 03-31-2000  Age: 22 y.o. MRN: [id number]  Chief Complaint  Patient presents with   Establish Care    Transferring from Dr. Shirlee Latch    HPI  Transfer of care: last seen by [facility] on 06/13/2021. Last seen by me on 07/04/2022   Anxiety/depression: state that she feels like it works well sometimes. States that eh proxac is hit and miss. States that she has been on prozac for an extended period of time. States that she has only been on prozac. States that she was on welbutrin   Dermatologist: states that she was being seen by one tha tmoved. [place] on [address] [place] the [place].  Retin a  Heavy bleeding: hx of anemia for chronic blood loss. States that she rarey takes the iron . IUD and ocp with [facility]  Pap smear: never had one just turned 21 and has an appointment in the next 2 days with GYN  Tdap: 2022 Flu vaccine: out of season and does get it for work  HPV: utd     Review of Systems  Constitutional:  Positive for chills and malaise/fatigue. Negative for fever.  Respiratory:  Positive for shortness of breath.   Cardiovascular:  Negative for chest pain.  Gastrointestinal:  Negative for abdominal pain, blood in stool, constipation, diarrhea, nausea and vomiting.       BM every other day    Genitourinary:  Negative for dysuria and hematuria.  Neurological:  Negative for tingling and headaches.  Psychiatric/Behavioral:  Negative for hallucinations and suicidal ideas.       Objective:     BP (!) 98/50   Pulse 75   Temp 97.8 F (36.6 C)   Resp 16   Ht 5' 2.91" (1.598 m)   Wt 138 lb 8 oz (62.8 kg)   SpO2 100%   BMI 24.60 kg/m  BP Readings from Last 3 Encounters:  08/15/22 (!) 98/50  07/04/22 108/60  06/12/22 122/76   Wt Readings from Last 3 Encounters:  08/15/22 138 lb 8 oz (62.8 kg)  07/04/22 140 lb 4 oz (63.6 kg)  06/12/22 137 lb 9.6 oz  (62.4 kg)      Physical Exam Vitals and nursing note reviewed.  Constitutional:      Appearance: Normal appearance.  HENT:     Right Ear: Tympanic membrane, ear canal and external ear normal.     Left Ear: Tympanic membrane, ear canal and external ear normal.     Nose:     Right Sinus: No maxillary sinus tenderness or frontal sinus tenderness.     Left Sinus: Maxillary sinus tenderness present. No frontal sinus tenderness.     Mouth/Throat:     Mouth: Mucous membranes are moist.     Pharynx: Oropharynx is clear.  Eyes:     Extraocular Movements: Extraocular movements intact.     Pupils: Pupils are equal, round, and reactive to light.  Cardiovascular:     Rate and Rhythm: Normal rate and regular rhythm.     Pulses: Normal pulses.     Heart sounds: Normal heart sounds.  Pulmonary:     Effort: Pulmonary effort is normal.     Breath sounds: Normal breath sounds.  Abdominal:     General: Bowel sounds are normal. There is no distension.     Palpations: There is no mass.     Tenderness: There  is no abdominal tenderness.     Hernia: No hernia is present.  Musculoskeletal:     Right lower leg: No edema.     Left lower leg: No edema.  Lymphadenopathy:     Cervical: Cervical adenopathy present.  Skin:    General: Skin is warm.  Neurological:     General: No focal deficit present.     Mental Status: She is alert.     Deep Tendon Reflexes:     Reflex Scores:      Bicep reflexes are 2+ on the right side and 2+ on the left side.      Patellar reflexes are 2+ on the right side and 2+ on the left side.    Comments: Bilateral upper and lower extremity strength 5/5  Psychiatric:        Mood and Affect: Mood normal.        Behavior: Behavior normal.        Thought Content: Thought content normal.        Judgment: Judgment normal.      No results found for any visits on 08/15/22.    The ASCVD Risk score (Arnett DK, et al., 2019) failed to calculate for the following reasons:    The 2019 ASCVD risk score is only valid for ages 61 to 60    Assessment & Plan:   Problem List Items Addressed This Visit       Immune and Lymphatic   Cervical lymphadenopathy    Some improvement still present has been evaluated by ENT and ultrasound performed        Other   Anxiety and depression    History of the same currently on fluoxetine and Wellbutrin.  Does not feel well-controlled.  Offered to change medications patient would like to see a psychiatrist ambulatory referral placed today.  Patient was amendable to therapy referral to psychology done today also.  No HI/SI/AVH      Relevant Orders   Ambulatory referral to Psychiatry   Ambulatory referral to Psychology   Fatigue    Ambiguous in multifactorial nature.  Patient does work third shift does have a history of vitamin D deficiency.  Also comorbid anxiety and depression will check basic labs inclusive of vitamin D and vitamin B12.      Relevant Orders   TSH   VITAMIN D 25 Hydroxy (Vit-D Deficiency, Fractures)   Vitamin B12   Preventative health care - Primary    Discussed age-appropriate immunizations and screening exams.  Patient is up-to-date on all vaccinations for her age range.  Patient is too young for CRC screening, breast cancer screening.  Patient is turned over the left to do cervical cancer screening has an appoint with GYN in 2 days.  Patient was given information at discharge about preventative healthcare maintenance with anticipatory guidance.      Relevant Orders   Comprehensive metabolic panel   TSH   PND (post-nasal drip)    Having some yellow mucus going down her throat for several weeks per patient report as patient use Flonase consistently for [redacted] week along with antihistamine.  If still having difficulty we can consider antibiotic treatment after that       Return in about 1 year (around 08/15/2023) for CPE and Labs.    Audria Nine, NP

## 2022-08-15 NOTE — Assessment & Plan Note (Signed)
Some improvement still present has been evaluated by ENT and ultrasound performed

## 2022-08-15 NOTE — Patient Instructions (Signed)
Nice to see you today Use the flonase and an over the counter antihistamine for the next week  Follow up with me in 1 year, sooner if you need me

## 2022-08-15 NOTE — Assessment & Plan Note (Signed)
Ambiguous in multifactorial nature.  Patient does work third shift does have a history of vitamin D deficiency.  Also comorbid anxiety and depression will check basic labs inclusive of vitamin D and vitamin B12.

## 2022-08-15 NOTE — Assessment & Plan Note (Signed)
Discussed age-appropriate immunizations and screening exams.  Patient is up-to-date on all vaccinations for her age range.  Patient is too young for CRC screening, breast cancer screening.  Patient is turned over the left to do cervical cancer screening has an appoint with GYN in 2 days.  Patient was given information at discharge about preventative healthcare maintenance with anticipatory guidance.

## 2022-08-15 NOTE — Assessment & Plan Note (Signed)
History of the same currently on fluoxetine and Wellbutrin.  Does not feel well-controlled.  Offered to change medications patient would like to see a psychiatrist ambulatory referral placed today.  Patient was amendable to therapy referral to psychology done today also.  No HI/SI/AVH

## 2022-08-16 LAB — VITAMIN B12: Vitamin B-12: 608 pg/mL (ref 232–1245)

## 2022-08-16 LAB — COMPREHENSIVE METABOLIC PANEL
ALT: 37 IU/L — ABNORMAL HIGH (ref 0–32)
AST: 32 IU/L (ref 0–40)
Albumin: 4.6 g/dL (ref 4.0–5.0)
Alkaline Phosphatase: 69 IU/L (ref 44–121)
BUN/Creatinine Ratio: 18 (ref 9–23)
BUN: 11 mg/dL (ref 6–20)
Bilirubin Total: <0.2 mg/dL (ref 0.0–1.2)
CO2: 22 mmol/L (ref 20–29)
Calcium: 9.7 mg/dL (ref 8.7–10.2)
Chloride: 103 mmol/L (ref 96–106)
Creatinine, Ser: 0.6 mg/dL (ref 0.57–1.00)
Globulin, Total: 2.3 g/dL (ref 1.5–4.5)
Glucose: 84 mg/dL (ref 70–99)
Potassium: 4 mmol/L (ref 3.5–5.2)
Sodium: 142 mmol/L (ref 134–144)
Total Protein: 6.9 g/dL (ref 6.0–8.5)
eGFR: 131 mL/min/{1.73_m2} (ref >59)

## 2022-08-16 LAB — VITAMIN D 25 HYDROXY (VIT D DEFICIENCY, FRACTURES): Vit D, 25-Hydroxy: 22.8 ng/mL — ABNORMAL LOW (ref 30.0–100.0)

## 2022-08-16 LAB — TSH: TSH: 1.37 u[IU]/mL (ref 0.450–4.500)

## 2022-08-17 ENCOUNTER — Other Ambulatory Visit: Payer: Self-pay | Admitting: Nurse Practitioner

## 2022-08-17 ENCOUNTER — Encounter: Payer: Self-pay | Admitting: Obstetrics & Gynecology

## 2022-08-17 ENCOUNTER — Ambulatory Visit (INDEPENDENT_AMBULATORY_CARE_PROVIDER_SITE_OTHER): Payer: BC Managed Care – PPO | Admitting: Obstetrics & Gynecology

## 2022-08-17 ENCOUNTER — Other Ambulatory Visit: Payer: Self-pay

## 2022-08-17 VITALS — BP 115/76 | HR 66 | Wt 137.0 lb

## 2022-08-17 DIAGNOSIS — Z01419 Encounter for gynecological examination (general) (routine) without abnormal findings: Secondary | ICD-10-CM

## 2022-08-17 DIAGNOSIS — Z113 Encounter for screening for infections with a predominantly sexual mode of transmission: Secondary | ICD-10-CM | POA: Diagnosis not present

## 2022-08-17 DIAGNOSIS — Z975 Presence of (intrauterine) contraceptive device: Secondary | ICD-10-CM

## 2022-08-17 DIAGNOSIS — N921 Excessive and frequent menstruation with irregular cycle: Secondary | ICD-10-CM

## 2022-08-17 DIAGNOSIS — E559 Vitamin D deficiency, unspecified: Secondary | ICD-10-CM

## 2022-08-17 MED ORDER — NORETHIN ACE-ETH ESTRAD-FE 1-20 MG-MCG(24) PO TABS
1.0000 | ORAL_TABLET | Freq: Every day | ORAL | 5 refills | Status: DC
Start: 2022-08-17 — End: 2022-08-17
  Filled 2022-08-17: qty 28, 28d supply, fill #0

## 2022-08-17 MED ORDER — NYLIA 1/35 1-35 MG-MCG PO TABS
1.0000 | ORAL_TABLET | Freq: Every day | ORAL | 5 refills | Status: DC
Start: 2022-08-17 — End: 2023-10-16
  Filled 2022-08-17: qty 28, 28d supply, fill #0
  Filled 2022-09-25: qty 28, 28d supply, fill #1
  Filled 2022-11-21: qty 28, 28d supply, fill #2
  Filled 2023-01-23: qty 28, 28d supply, fill #3
  Filled 2023-03-28: qty 28, 28d supply, fill #4
  Filled 2023-07-10: qty 28, 28d supply, fill #5

## 2022-08-17 MED ORDER — VITAMIN D (ERGOCALCIFEROL) 1.25 MG (50000 UNIT) PO CAPS
50000.0000 [IU] | ORAL_CAPSULE | ORAL | 0 refills | Status: DC
Start: 2022-08-17 — End: 2022-09-26
  Filled 2022-08-17: qty 5, 34d supply, fill #0

## 2022-08-17 NOTE — Progress Notes (Signed)
GYNECOLOGY ANNUAL PREVENTATIVE CARE ENCOUNTER NOTE  History:     Andrea Stevens is a 22 y.o. G0P0000 female here for a routine annual gynecologic exam.  Current complaints: breakthrough bleeding with IUD in place x 1 month. Had this before, was treated successfully with Andrea Stevens, desires refill.  Andrea Stevens placed 01/2019. Desires pap and STI screen.   Denies abnormal vaginal discharge, pelvic pain, problems with intercourse or other gynecologic concerns.  She is a Designer, industrial/product at [employer], applying to medical school as she wants to be a cardiothoracic surgeon    Gynecologic History Patient's last menstrual period was 07/31/2022. Contraception: [name] IUD Has received HPV vaccine series.  Obstetric History OB History  Gravida Para Term Preterm AB Living  0 0 0 0 0 0  SAB IAB Ectopic Multiple Live Births  0 0 0 0 0    Past Medical History:  Diagnosis Date   Allergy    Anorexia    age 64 yo as of 22 y.o not active    Chronic headaches    Depression, major, single episode    Eating disorder 2015   resolved.   Migraines    Mononucleosis    in 2023   PONV (postoperative nausea and vomiting)    UTI (urinary tract infection)     Past Surgical History:  Procedure Laterality Date   FOOT SURGERY     tailors bunion 2019   GANGLION CYST EXCISION Left 07/30/2018   Procedure: REMOVAL GANGLION OF LEFT DORSAL WRIST;  Surgeon: Betha Loa, MD;  Location: [facility];  Service: Orthopedics;  Laterality: Left;  bier block   TONSILLECTOMY     2008   TONSILLECTOMY AND ADENOIDECTOMY     TYMPANOSTOMY TUBE PLACEMENT     WISDOM TOOTH EXTRACTION      Current Outpatient Medications on File Prior to Visit  Medication Sig Dispense Refill   ferrous sulfate 325 (65 FE) MG tablet Take by mouth.     fluticasone (FLONASE) 50 MCG/ACT nasal spray Place into both nostrils daily.     Levonorgestrel (Andrea Stevens) 19.5 MG IUD by Intrauterine route.     tretinoin (RETIN-A) 0.025 % cream       buPROPion (WELLBUTRIN XL) 150 MG 24 hr tablet TAKE 1 TABLET BY MOUTH DAILY. (Patient taking differently: Take 150 mg by mouth daily.) 90 tablet 3   FLUoxetine (PROZAC) 20 MG capsule TAKE 1 CAPSULE BY MOUTH DAILY. 90 capsule 3   No current facility-administered medications on file prior to visit.    Allergies  Allergen Reactions   Fentanyl     Nausea    Oxycodone Nausea Only    dizziness   Versed [Midazolam]     nausea    Social History:  reports that she has never smoked. She has never used smokeless tobacco. She reports current alcohol use. She reports that she does not use drugs.  Family History  Problem Relation Age of Onset   Diabetes Mother        type 1   Heart disease Mother    Arthritis Mother    Learning disabilities Mother        dyslexia   Miscarriages / [place] Mother    Allergies Father    Kidney disease Father    Hypertension Maternal Grandmother    Anxiety disorder Maternal Grandmother    Hyperlipidemia Maternal Grandmother    Mental illness Maternal Grandmother    Diabetes Maternal Grandfather    Hypertension Maternal Grandfather  Arthritis Maternal Grandfather    Hearing loss Maternal Grandfather    Heart disease Maternal Grandfather    Hyperlipidemia Maternal Grandfather    Parkinson's disease Maternal Grandfather    Heart disease Paternal Grandmother    Arthritis Paternal Grandmother    Hypertension Paternal Grandmother    Cancer Paternal Grandfather        skin melanoma    The following portions of the patient's history were reviewed and updated as appropriate: allergies, current medications, past family history, past medical history, past social history, past surgical history and problem list.  Review of Systems Pertinent items noted in HPI and remainder of comprehensive ROS otherwise negative.  Physical Exam:  BP 115/76   Pulse 66   Wt 137 lb (62.1 kg)   LMP 07/31/2022   BMI 24.34 kg/m  CONSTITUTIONAL: Well-developed,  well-nourished female in no acute distress.  HENT:  Normocephalic, atraumatic, External right and left ear normal.  EYES: Conjunctivae and EOM are normal. Pupils are equal, round, and reactive to light. No scleral icterus.  NECK: Normal range of motion, supple, no masses.  Normal thyroid.  SKIN: Skin is warm and dry. No rash noted. Not diaphoretic. No erythema. No pallor. MUSCULOSKELETAL: Normal range of motion. No tenderness.  No cyanosis, clubbing, or edema. NEUROLOGIC: Alert and oriented to person, place, and time. Normal reflexes, muscle tone coordination.  PSYCHIATRIC: Normal mood and affect. Normal behavior. Normal judgment and thought content. CARDIOVASCULAR: Normal heart rate noted, regular rhythm RESPIRATORY: Clear to auscultation bilaterally. Effort and breath sounds normal, no problems with respiration noted. BREASTS: Symmetric in size. No masses, tenderness, skin changes, nipple drainage, or lymphadenopathy bilaterally. Performed in the presence of a chaperone. ABDOMEN: Soft, no distention noted.  No tenderness, rebound or guarding.  PELVIC: Normal appearing external genitalia and urethral meatus; normal appearing vaginal mucosa and cervix.  Blue IUD strings visualized.  Bloody vaginal discharge noted.  Pap smear obtained.  Normal uterine size, no other palpable masses, no uterine or adnexal tenderness.  Performed in the presence of a chaperone.   Assessment and Plan:     1. IUD (intrauterine device) in place 2. Breakthrough bleeding with IUD She wanted Andrea Stevens prescribed, three months supply. This was done.  IUD can be removed at any time or 01/2024. - norethindrone-ethinyl estradiol 1/35 (Andrea Stevens 1/35) tablet; Take 1 tablet by mouth daily.  Dispense: 84 tablet; Refill: 5  3. Routine screening for STI (sexually transmitted infection) - RPR+HBsAg+HCVAb+HIV Also did ancillary testing on pap smear.  4. Well woman exam with routine gynecological exam -  IGP,CTNG,RFXAPTHPV,RFX16/18,45 Will follow up results of pap smear and manage accordingly. Routine preventative health maintenance measures emphasized. Please refer to After Visit Summary for other counseling recommendations.      Jaynie Collins, MD, FACOG Obstetrician & Gynecologist, [facility] for [employer], [facility]

## 2022-08-18 LAB — RPR+HBSAG+HCVAB+...
HIV Screen 4th Generation wRfx: NONREACTIVE
Hep C Virus Ab: NONREACTIVE
Hepatitis B Surface Ag: NEGATIVE
RPR Ser Ql: NONREACTIVE

## 2022-08-21 LAB — IGP,CTNG,RFXAPTHPV,RFX16/18,45
Chlamydia, Nuc. Acid Amp: NEGATIVE
Gonococcus by Nucleic Acid Amp: NEGATIVE
PAP Smear Comment: 0

## 2022-09-04 IMAGING — US US PELVIS COMPLETE
1 series · 15 of 25 positions shown · non-contrast
Comparison: 08/22/2017

CLINICAL DATA: Breakthrough bleeding with IUD

EXAM:
TRANSABDOMINAL ULTRASOUND OF PELVIS
TECHNIQUE: Transabdominal ultrasound examination of the pelvis was performed
including evaluation of the uterus, ovaries, adnexal regions, and
pelvic cul-de-sac.

[Series 1: us pelvis complete · 15 of 42 slices shown]
[im 1/42]
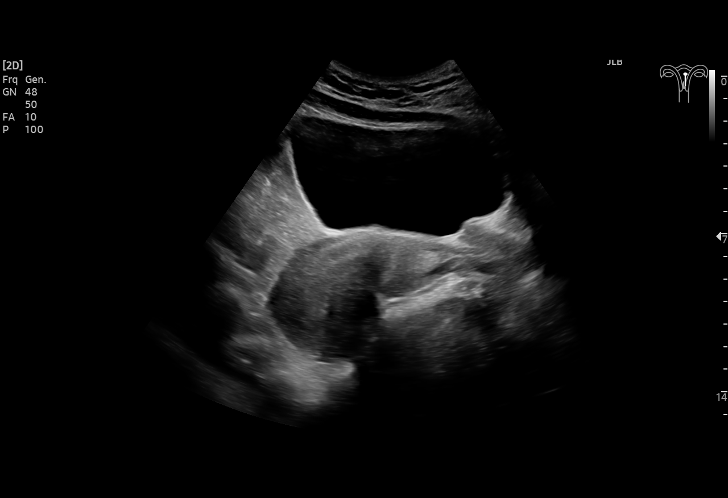
[im 4/42]
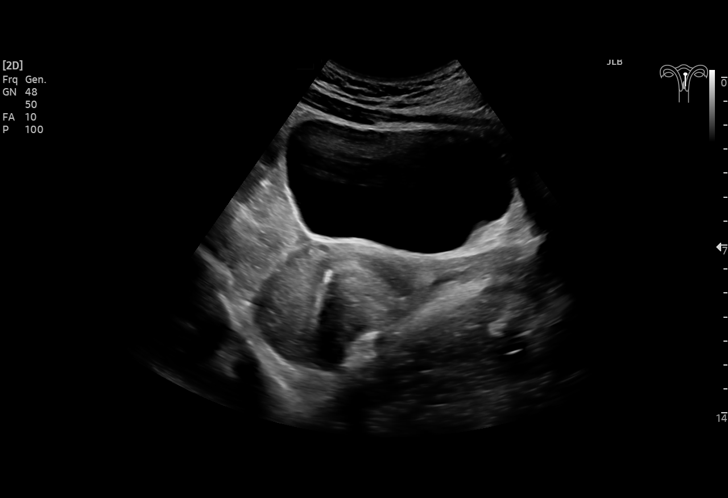
[im 7/42]
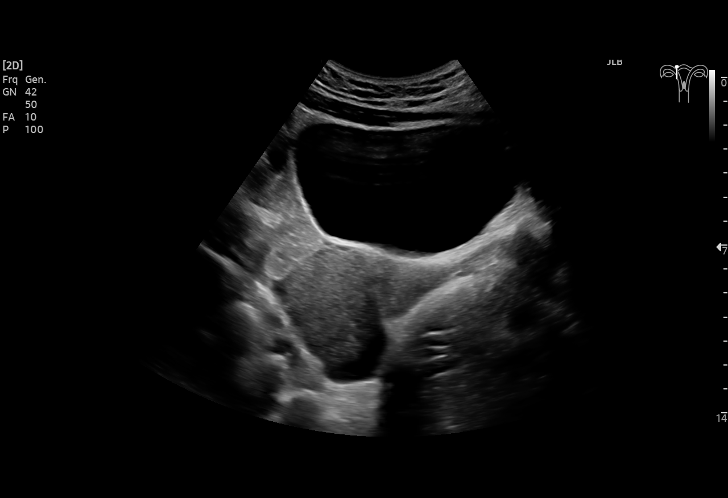
[im 9/42]
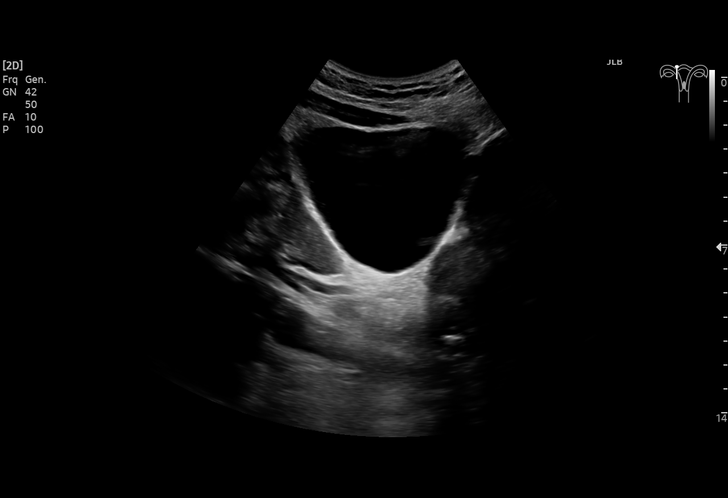
[im 12/42]
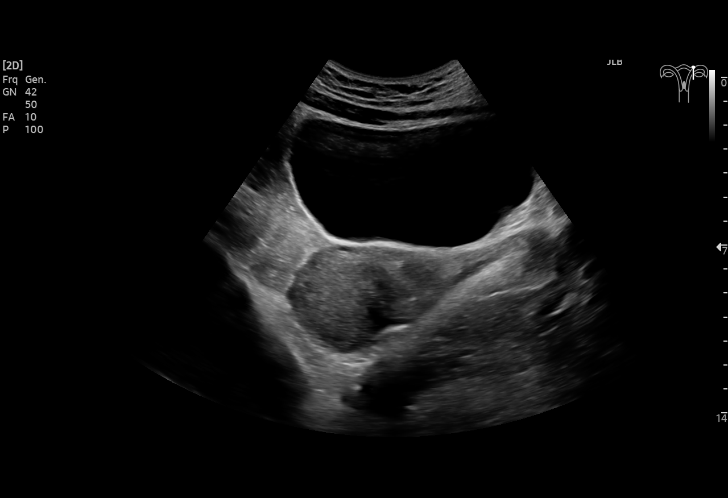
[im 16/42]
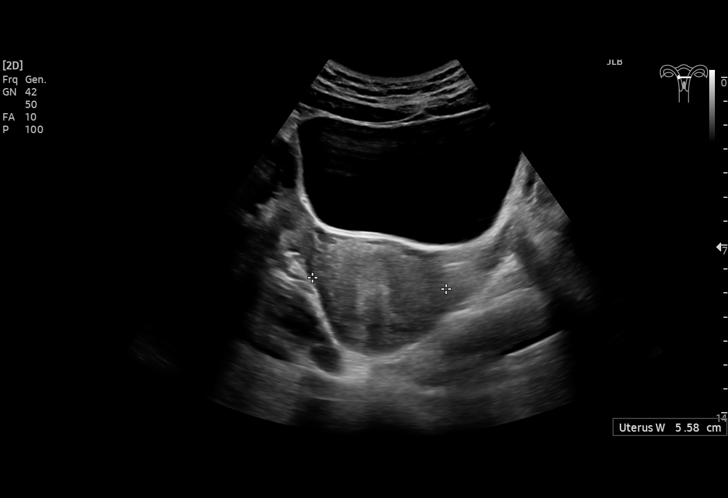
[im 18/42]
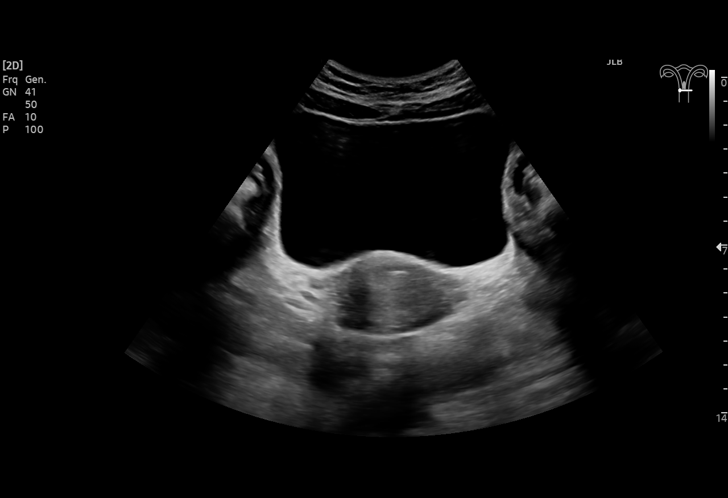
[im 21/42]
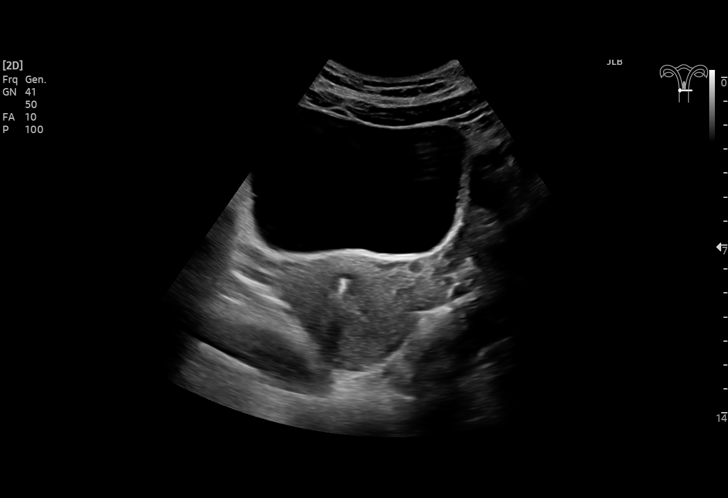
[im 24/42]
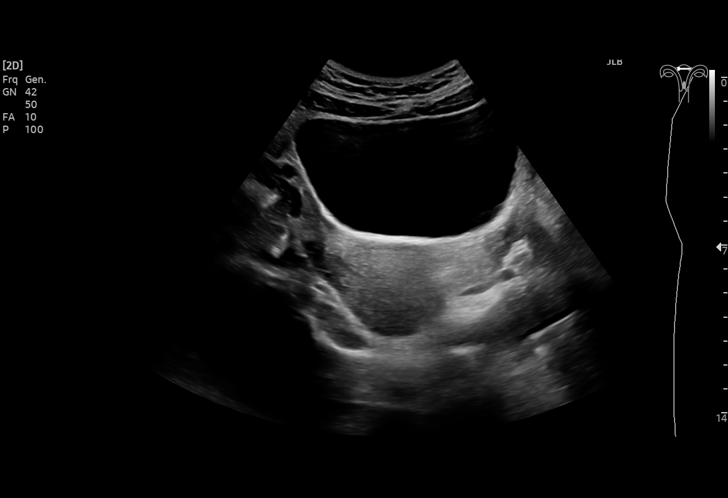
[im 26/42]
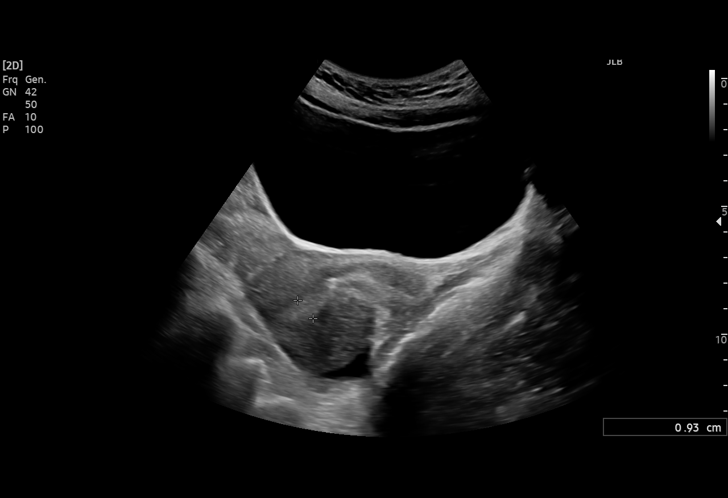
[im 30/42]
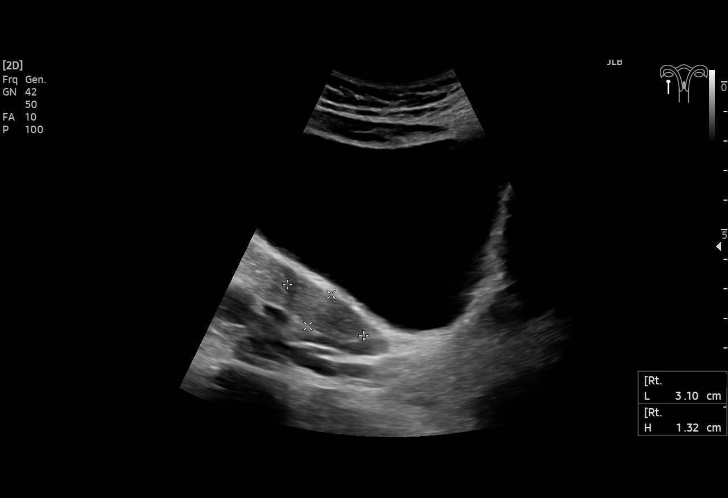
[im 33/42]
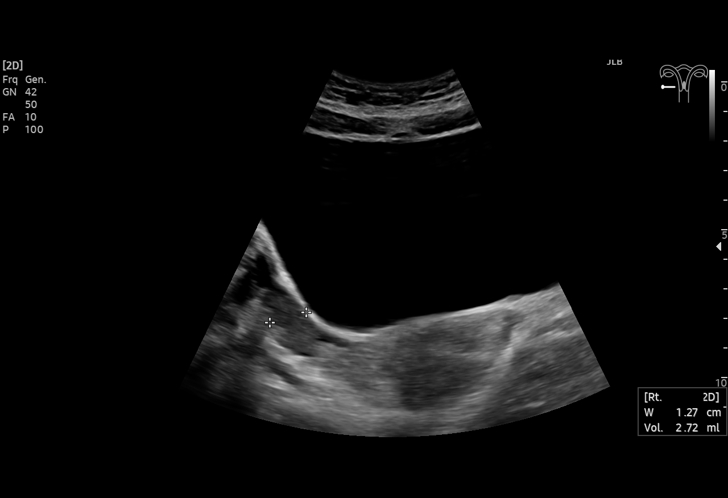
[im 35/42]
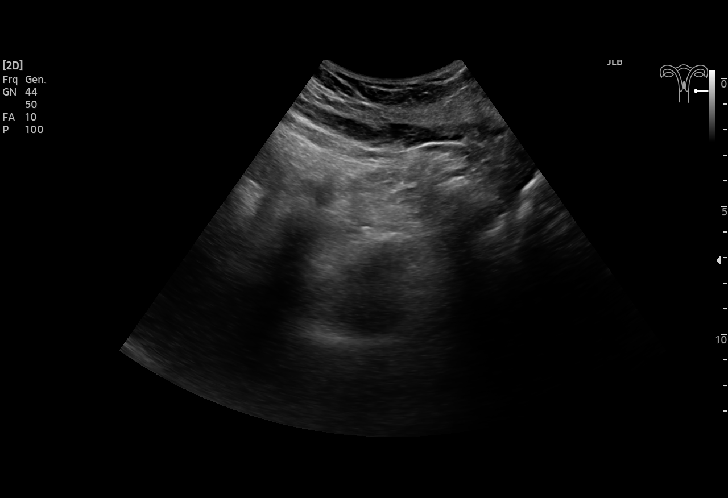
[im 38/42]
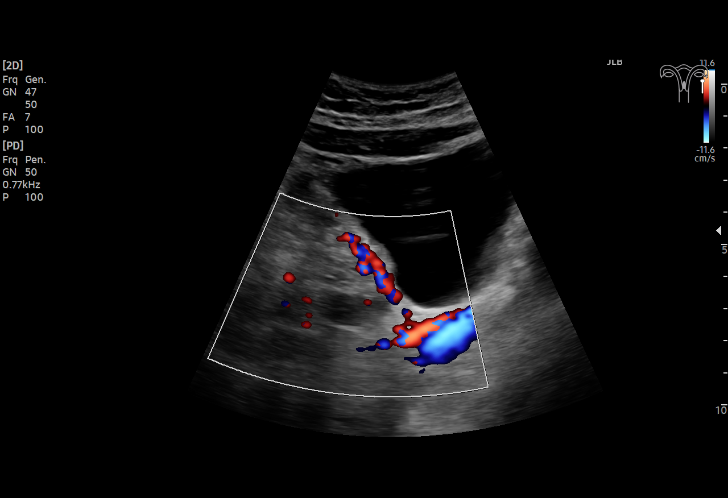
[im 42/42]
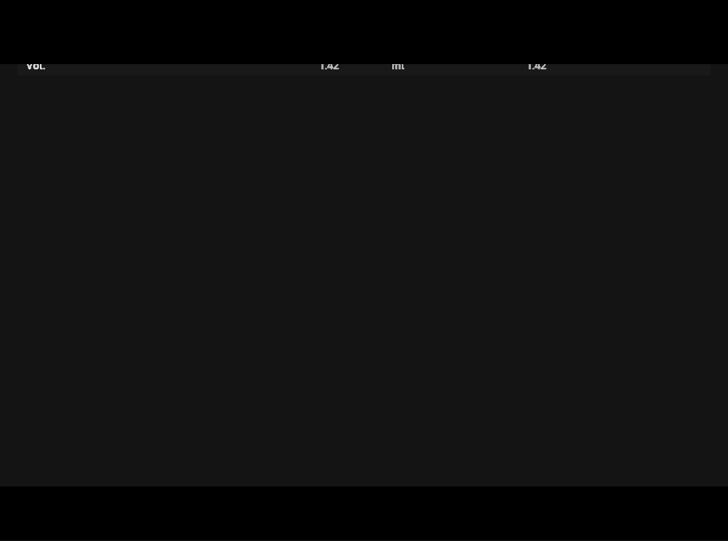

[15 of 25 positions shown; findings below may reference images not displayed]

FINDINGS: Uterus

Measurements: 8.1 x 4.7 x 5.6 cm = volume: 111.5 mL. No fibroids or
other mass visualized.

Endometrium

Thickness: 9.3 mm. Low lying IUD visible within the lower uterine
segment and upper cervix.

Right ovary

Measurements: 3.1 x 1.3 x 1.3 cm = volume: 2.7 mL. Normal
appearance/no adnexal mass.

Left ovary

Measurements: 2.3 x 1 x 1.2 cm = volume: 1.4 mL. Normal
appearance/no adnexal mass.

Other findings:  Trace free fluid.
IMPRESSION: 1. Low lying IUD visible within the lower uterine segment and upper
cervix.
2. Normal premenopausal endometrial thickness
3. Trace free fluid

## 2022-09-25 ENCOUNTER — Other Ambulatory Visit: Payer: Self-pay

## 2022-09-26 ENCOUNTER — Encounter: Payer: Self-pay | Admitting: Nurse Practitioner

## 2022-09-26 ENCOUNTER — Ambulatory Visit: Payer: BC Managed Care – PPO | Admitting: Nurse Practitioner

## 2022-09-26 VITALS — BP 100/60 | HR 69 | Temp 98.5°F | Ht 62.0 in | Wt 140.6 lb

## 2022-09-26 DIAGNOSIS — R21 Rash and other nonspecific skin eruption: Secondary | ICD-10-CM

## 2022-09-26 DIAGNOSIS — E559 Vitamin D deficiency, unspecified: Secondary | ICD-10-CM

## 2022-09-26 MED ORDER — METHYLPREDNISOLONE ACETATE 80 MG/ML IJ SUSP
80.0000 mg | Freq: Once | INTRAMUSCULAR | Status: AC
Start: 2022-09-26 — End: 2022-09-26
  Administered 2022-09-26: 80 mg via INTRAMUSCULAR

## 2022-09-26 NOTE — Assessment & Plan Note (Signed)
Patient completed 5 weeks of prescription vitamin D panel vitamin D level today.

## 2022-09-26 NOTE — Patient Instructions (Signed)
Nice to see you today We gave you a dose of steroids in office.  Follow up if no improvement Try tepid bath water and pat dry vs scrub dry

## 2022-09-26 NOTE — Progress Notes (Signed)
   Acute Office Visit  Subjective:     Patient ID: Andrea Stevens, female    DOB: [birth year], 22 y.o.   MRN: [id number]  Chief Complaint  Patient presents with   Rash    Pt states rash all over body that started a week ago. Slightly itchy. Red patches. Pt thinks it may be Psoriasis. No pain. Pt states it gets worse after she showers.      Patient is in today for Rash with a history of allergic rhinitis, MDD,iron def  Started a week ago. States that it has gotten worse over the past couple days. States that she uses cerva that she used for her face and it has not helps. States that showering maes it red. States that it does itch some times. States a little flaky, but no bleeding. No new exposures at work. Patient denies changing any soaps, shampoos, or laundry detergents. She does have several cats at home. Patient is curious if she has psoriasis.  States her mother has psoriasis but is medication induced.     Review of Systems  Constitutional:  Negative for chills and fever.  Respiratory:  Negative for shortness of breath.   Cardiovascular:  Negative for chest pain.  Skin:  Positive for itching and rash.  Neurological:  Negative for headaches.        Objective:    BP 100/60   Pulse 69   Temp 98.5 F (36.9 C) (Temporal)   Ht 5\' 2"  (1.575 m)   Wt 140 lb 9.6 oz (63.8 kg)   SpO2 97%   BMI 25.72 kg/m    Physical Exam Vitals and nursing note reviewed.  Constitutional:      Appearance: Normal appearance.  Cardiovascular:     Rate and Rhythm: Normal rate and regular rhythm.     Heart sounds: Normal heart sounds.  Pulmonary:     Effort: Pulmonary effort is normal.     Breath sounds: Normal breath sounds.  Skin:    General: Skin is warm.     Findings: Rash present.          Comments: Small circular lesions without central clearing.  Coalesening Papular in nature  Small circular lesion that are singular and papular in nature  Neurological:     Mental Status: She is  alert.     No results found for any visits on 09/26/22.      Assessment & Plan:   Problem List Items Addressed This Visit       Musculoskeletal and Integument   Rash - Primary    Ambiguous in nature.  Could be the early beginnings of a fungal infection but not overly apparent since there is no central clearing.  It is on the flexural bends.  Will treat patient with Depo-Medrol 80 mg IM x 1 dose.  Patient encouraged to keep her dermatology appointment scheduled for 11/17/2022 but she will reach out to me prior if the rash does not improve        Other   Vitamin D deficiency    Patient completed 5 weeks of prescription vitamin D panel vitamin D level today.      Relevant Orders   VITAMIN D 25 Hydroxy (Vit-D Deficiency, Fractures)    Meds ordered this encounter  Medications   methylPREDNISolone acetate (DEPO-MEDROL) injection 80 mg    Return if symptoms worsen or fail to improve.  Audria Nine, NP

## 2022-09-26 NOTE — Assessment & Plan Note (Signed)
Ambiguous in nature.  Could be the early beginnings of a fungal infection but not overly apparent since there is no central clearing.  It is on the flexural bends.  Will treat patient with Depo-Medrol 80 mg IM x 1 dose.  Patient encouraged to keep her dermatology appointment scheduled for 11/17/2022 but she will reach out to me prior if the rash does not improve

## 2022-09-28 ENCOUNTER — Encounter: Payer: Self-pay | Admitting: Nurse Practitioner

## 2022-09-28 ENCOUNTER — Other Ambulatory Visit: Payer: Self-pay

## 2022-09-28 MED ORDER — PREDNISONE 20 MG PO TABS
ORAL_TABLET | ORAL | 0 refills | Status: AC
  Filled 2022-09-28: qty 9, 6d supply, fill #0

## 2022-10-10 ENCOUNTER — Other Ambulatory Visit (HOSPITAL_COMMUNITY): Payer: Self-pay

## 2022-10-11 ENCOUNTER — Other Ambulatory Visit: Payer: Self-pay

## 2022-10-11 MED ORDER — FLUCONAZOLE 150 MG PO TABS
150.0000 mg | ORAL_TABLET | Freq: Once | ORAL | 0 refills | Status: AC
Start: 2022-10-11 — End: 2022-10-12
  Filled 2022-10-11: qty 1, 1d supply, fill #0

## 2022-10-11 NOTE — Addendum Note (Signed)
Addended by: Eden Emms on: 10/11/2022 07:28 AM   Modules accepted: Orders

## 2022-11-14 ENCOUNTER — Encounter: Payer: Self-pay | Admitting: Dermatology

## 2022-11-14 ENCOUNTER — Other Ambulatory Visit: Payer: Self-pay

## 2022-11-14 ENCOUNTER — Ambulatory Visit: Payer: BC Managed Care – PPO | Admitting: Dermatology

## 2022-11-14 DIAGNOSIS — D2339 Other benign neoplasm of skin of other parts of face: Secondary | ICD-10-CM | POA: Diagnosis not present

## 2022-11-14 DIAGNOSIS — D239 Other benign neoplasm of skin, unspecified: Secondary | ICD-10-CM

## 2022-11-14 DIAGNOSIS — L858 Other specified epidermal thickening: Secondary | ICD-10-CM | POA: Diagnosis not present

## 2022-11-14 DIAGNOSIS — L700 Acne vulgaris: Secondary | ICD-10-CM

## 2022-11-14 DIAGNOSIS — R21 Rash and other nonspecific skin eruption: Secondary | ICD-10-CM

## 2022-11-14 MED ORDER — TRETINOIN 0.025 % EX CREA
1.0000 | TOPICAL_CREAM | Freq: Every day | CUTANEOUS | 11 refills | Status: AC
Start: 2022-11-14 — End: 2023-11-14
  Filled 2022-11-14: qty 45, 34d supply, fill #0

## 2022-11-14 NOTE — Progress Notes (Signed)
New Patient Visit   Subjective  Andrea Stevens is a 22 y.o. female who presents for the following: Patient here to discuss acne on her face, treating with Tretinoin 0.025% cream with a good response. Check her chest patient report she may have had psoriasis a few months ago, she went to her PCP and was given a steroid injection and oral prednisone which helped, rash clear today  Patient c/o a mole on her forehead since childhood, she would like removed by plastic surgeon.     The following portions of the chart were reviewed this encounter and updated as appropriate: medications, allergies, medical history  Review of Systems:  No other skin or systemic complaints except as noted in HPI or Assessment and Plan.  Objective  Well appearing patient in no apparent distress; mood and affect are within normal limits.  Areas Examined: Face, chest and back  Relevant exam findings are noted in the Assessment and Plan.      Assessment & Plan   ACNE VULGARIS Exam: closed comedones, inflamed papules, and pustules on the forehead > cheeks  Chronic condition with duration or expected duration over one year. Currently flaring and not at patient goal.   Treatment Plan: Continue Tretinoin .025% cream apply to face at bedtime  Topical retinoid medications like tretinoin/Retin-A, adapalene/Differin, tazarotene/Fabior, and Epiduo/Epiduo Forte can cause dryness and irritation when first started. Only apply a pea-sized amount to the entire affected area. Avoid applying it around the eyes, edges of mouth and creases at the nose. If you experience irritation, use a good moisturizer first and/or apply the medicine less often. If you are doing well with the medicine, you can increase how often you use it until you are applying every night. Be careful with sun protection while using this medication as it can make you sensitive to the sun. This medicine should not be used by pregnant women.    DERMAL NEVUS   Exam: 3 x 4 mm flesh papule with pigment on Left forehead See photo  Treatment Plan: Benign appearing on exam today.  We will send a referral to a plastic surgeon for removal.    KERATOSIS PILARIS - Tiny follicular keratotic papules - Benign. Genetic in nature. No cure. - Observe. - If desired, patient can use an emollient (moisturizer) containing ammonium lactate (AmLactin), urea or salicylic acid once a day to smooth the area  Recommend starting moisturizer with exfoliant (Urea, Salicylic acid, or Lactic acid) one to two times daily to help smooth rough and bumpy skin.  OTC options include Cetaphil Rough and Bumpy lotion (Urea), Eucerin Roughness Relief lotion or spot treatment cream (Urea), CeraVe SA lotion/cream for Rough and Bumpy skin (Sal Acid), Gold Bond Rough and Bumpy cream (Sal Acid), and AmLactin 12% lotion/cream (Lactic Acid).  If applying in morning, also apply sunscreen to sun-exposed areas, since these exfoliating moisturizers can increase sensitivity to sun.     Previous rash in August, PSORIASIS was suspected Clear skin on the chest and left arm from intramuscular and oral prednisone prescribed by her PCP Photos from google reviewed on patients cell phone in the office.  Plan: Patient instructed to call here if the rash on her chest return and we will work her in the office when the rash is flared  Generally avoid prednisone for psoriasis given risk of worsening  Return if symptoms worsen or fail to improve.  IAngelique Holm, CMA, am acting as scribe for Elie Goody, MD .   Documentation:  I have reviewed the above documentation for accuracy and completeness, and I agree with the above.  Elie Goody, MD

## 2022-11-14 NOTE — Patient Instructions (Addendum)
Keratosis pilaris-arms  Recommend starting moisturizer with exfoliant (Urea, Salicylic acid, or Lactic acid) one to two times daily to help smooth rough and bumpy skin.  OTC options include Cetaphil Rough and Bumpy lotion (Urea), Eucerin Roughness Relief lotion or spot treatment cream (Urea), CeraVe SA lotion/cream for Rough and Bumpy skin (Sal Acid), Gold Bond Rough and Bumpy cream (Sal Acid), and AmLactin 12% lotion/cream (Lactic Acid).  If applying in morning, also apply sunscreen to sun-exposed areas, since these exfoliating moisturizers can increase sensitivity to sun.        Due to recent changes in healthcare laws, you may see results of your pathology and/or laboratory studies on MyChart before the doctors have had a chance to review them. We understand that in some cases there may be results that are confusing or concerning to you. Please understand that not all results are received at the same time and often the doctors may need to interpret multiple results in order to provide you with the best plan of care or course of treatment. Therefore, we ask that you please give [place] 2 business days to thoroughly review all your results before contacting the office for clarification. Should we see a critical lab result, you will be contacted sooner.   If You Need Anything After Your Visit  If you have any questions or concerns for your doctor, please call our main line at [phone] and press option 4 to reach your doctor's medical assistant. If no one answers, please leave a voicemail as directed and we will return your call as soon as possible. Messages left after 4 pm will be answered the following business day.   You may also send [place] a message via MyChart. We typically respond to MyChart messages within 1-2 business days.  For prescription refills, please ask your pharmacy to contact our office. Our fax number is [phone].  If you have an urgent issue when the clinic is closed that cannot  wait until the next business day, you can page your doctor at the number below.    Please note that while we do our best to be available for urgent issues outside of office hours, we are not available 24/7.   If you have an urgent issue and are unable to reach [place], you may choose to seek medical care at your doctor's office, retail clinic, urgent care center, or emergency room.  If you have a medical emergency, please immediately call 911 or go to the emergency department.  Pager Numbers  - Dr. Gwen Pounds: [phone]  - Dr. Roseanne Reno: [phone]  - Dr. Katrinka Blazing: [phone]   In the event of inclement weather, please call our main line at [phone] for an update on the status of any delays or closures.  Dermatology Medication Tips: Please keep the boxes that topical medications come in in order to help keep track of the instructions about where and how to use these. Pharmacies typically print the medication instructions only on the boxes and not directly on the medication tubes.   If your medication is too expensive, please contact our office at [phone] option 4 or send [place] a message through MyChart.   We are unable to tell what your co-pay for medications will be in advance as this is different depending on your insurance coverage. However, we may be able to find a substitute medication at lower cost or fill out paperwork to get insurance to cover a needed medication.   If a prior authorization is required to get  your medication covered by your insurance company, please allow [place] 1-2 business days to complete this process.  Drug prices often vary depending on where the prescription is filled and some pharmacies may offer cheaper prices.  The website www.goodrx.com contains coupons for medications through different pharmacies. The prices here do not account for what the cost may be with help from insurance (it may be cheaper with your insurance), but the website can give you the price  if you did not use any insurance.  - You can print the associated coupon and take it with your prescription to the pharmacy.  - You may also stop by our office during regular business hours and pick up a GoodRx coupon card.  - If you need your prescription sent electronically to a different pharmacy, notify our office through [facility] or by phone at [phone] option 4.     Si Usted Necesita Algo Despus de Su Visita  Tambin puede enviarnos un mensaje a travs de Clinical cytogeneticist. Por lo general respondemos a los mensajes de MyChart en el transcurso de 1 a 2 das hbiles.  Para renovar recetas, por favor pida a su farmacia que se ponga en contacto con nuestra oficina. Annie Sable de fax es [place] [phone].  Si tiene un asunto urgente cuando la clnica est cerrada y que no puede esperar hasta el siguiente da hbil, puede llamar/localizar a su doctor(a) al nmero que aparece a continuacin.   Por favor, tenga en cuenta que aunque hacemos todo lo posible para estar disponibles para asuntos urgentes fuera del horario de [place], no estamos disponibles las 24 horas del da, los 7 [address]  [address] de la [place].   Si tiene un problema urgente y no puede comunicarse con nosotros, puede optar por buscar atencin mdica  en el consultorio de su doctor(a), en una clnica privada, en un centro de atencin urgente o en una sala de emergencias.  Si tiene Engineer, drilling, por favor llame inmediatamente al 911 o vaya a la sala de emergencias.  Nmeros de bper  - Dr. Gwen Pounds: [phone]  - Dra. Roseanne Reno: [phone]  - Dr. Katrinka Blazing: [phone]   En caso de inclemencias del tiempo, por favor llame a Lacy Duverney principal al [phone] para una actualizacin sobre el [place] de cualquier retraso o cierre.  Consejos para la medicacin en dermatologa: Por favor, guarde las cajas en las que vienen los medicamentos de uso tpico para ayudarle a seguir las instrucciones sobre dnde y cmo  usarlos. Las farmacias generalmente imprimen las instrucciones del medicamento slo en las cajas y no directamente en los tubos del [place].   Si su medicamento es muy caro, por favor, pngase en contacto con Rolm Gala llamando al [phone] y presione la opcin 4 o envenos un mensaje a travs de Clinical cytogeneticist.   No podemos decirle cul ser su copago por los medicamentos por adelantado ya que esto es diferente dependiendo de la cobertura de su seguro. Sin embargo, es posible que podamos encontrar un medicamento sustituto a Audiological scientist un formulario para que el seguro cubra el medicamento que se considera necesario.   Si se requiere una autorizacin previa para que su compaa de seguros [place] su medicamento, por favor permtanos de 1 a 2 das hbiles para completar [address]  Los precios de los medicamentos varan con frecuencia dependiendo del Environmental consultant de dnde se surte la receta y alguna farmacias pueden ofrecer precios ms baratos.  El sitio web www.goodrx.com tiene cupones para medicamentos de Health and safety inspector.  Los precios aqu no tienen en cuenta lo que podra costar con la ayuda del seguro (puede ser ms barato con su seguro), pero el sitio web puede darle el precio si no utiliz Tourist information centre manager.  - Puede imprimir el cupn correspondiente y llevarlo con su receta a la farmacia.  - Tambin puede pasar por nuestra oficina durante el horario de atencin regular y Education officer, museum una tarjeta de cupones de GoodRx.  - Si necesita que su receta se enve electrnicamente a una farmacia diferente, informe a nuestra oficina a travs de MyChart de [place] o por telfono llamando al [phone] y presione la opcin 4.

## 2022-11-21 ENCOUNTER — Other Ambulatory Visit: Payer: Self-pay

## 2022-12-06 ENCOUNTER — Ambulatory Visit: Payer: BC Managed Care – PPO | Admitting: Plastic Surgery

## 2022-12-06 ENCOUNTER — Encounter: Payer: Self-pay | Admitting: Plastic Surgery

## 2022-12-06 VITALS — BP 112/73 | HR 77 | Ht 63.0 in | Wt 135.0 lb

## 2022-12-06 DIAGNOSIS — L989 Disorder of the skin and subcutaneous tissue, unspecified: Secondary | ICD-10-CM

## 2022-12-06 NOTE — Progress Notes (Signed)
Referring Provider Andrea Emms, NP [address] [place],  [place] [address]   CC:  Chief Complaint  Patient presents with   Consult           Andrea Stevens is an 22 y.o. female.  HPI: Andrea Stevens is a 22 year old female who is referred by her dermatologist for excision of a benign appearing nevus on her forehead.  Patient states that the lesion has been present for all of her life but she has noted a slow growth recently.  She and her dermatologist are requesting excision.  Allergies  Allergen Reactions   Fentanyl     Nausea    Oxycodone Nausea Only    dizziness   Versed [Midazolam]     nausea    Outpatient Encounter Medications as of 12/06/2022  Medication Sig   ferrous sulfate 325 (65 FE) MG tablet Take by mouth.   fluticasone (FLONASE) 50 MCG/ACT nasal spray Place into both nostrils daily.   Levonorgestrel ([name]) 19.5 MG IUD by Intrauterine route.   norethindrone-ethinyl estradiol 1/35 ([name] 1/35) tablet Take 1 tablet by mouth daily.   tretinoin (RETIN-A) 0.025 % cream    tretinoin (RETIN-A) 0.025 % cream Apply 1 Application topically at bedtime.   buPROPion (WELLBUTRIN XL) 150 MG 24 hr tablet TAKE 1 TABLET BY MOUTH DAILY. (Patient taking differently: Take 150 mg by mouth daily.)   FLUoxetine (PROZAC) 20 MG capsule TAKE 1 CAPSULE BY MOUTH DAILY.   No facility-administered encounter medications on file as of 12/06/2022.     Past Medical History:  Diagnosis Date   Allergy    Anorexia    age 49 yo as of 22 y.o not active    Chronic headaches    Depression, major, single episode    Eating disorder 2015   resolved.   Migraines    Mononucleosis    in 2023   PONV (postoperative nausea and vomiting)    UTI (urinary tract infection)     Past Surgical History:  Procedure Laterality Date   FOOT SURGERY     tailors bunion 2019   GANGLION CYST EXCISION Left 07/30/2018   Procedure: REMOVAL GANGLION OF LEFT DORSAL WRIST;  Surgeon: Betha Loa, MD;   Location: [facility];  Service: Orthopedics;  Laterality: Left;  bier block   TONSILLECTOMY     2008   TONSILLECTOMY AND ADENOIDECTOMY     TYMPANOSTOMY TUBE PLACEMENT     WISDOM TOOTH EXTRACTION      Family History  Problem Relation Age of Onset   Diabetes Mother        type 1   Heart disease Mother    Arthritis Mother    Learning disabilities Mother        dyslexia   Miscarriages / [place] Mother    Allergies Father    Kidney disease Father    Hypertension Maternal Grandmother    Anxiety disorder Maternal Grandmother    Hyperlipidemia Maternal Grandmother    Mental illness Maternal Grandmother    Diabetes Maternal Grandfather    Hypertension Maternal Grandfather    Arthritis Maternal Grandfather    Hearing loss Maternal Grandfather    Heart disease Maternal Grandfather    Hyperlipidemia Maternal Grandfather    Parkinson's disease Maternal Grandfather    Heart disease Paternal Grandmother    Arthritis Paternal Grandmother    Hypertension Paternal Grandmother    Cancer Paternal Grandfather        skin melanoma  Social History   Social History Narrative   1 twin sister Andrea Stevens    Lives with sister and mom    Moved from [place]    In [place] interested in medicine 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  She states that this is not tender and has been present for her entire life.  Physical Exam    12/06/2022    1:47 PM 09/26/2022   10:18 AM 08/17/2022    3:19 PM  Vitals with BMI  Height 5\' 3"  5\' 2"    Weight 135 lbs 140 lbs 10 oz 137 lbs  BMI 23.92 25.71 24.34  Systolic 112 100 409  Diastolic 73 60 76  Pulse 77 69 66    General:  No acute distress,  Alert and oriented, Non-Toxic, Normal speech and affect Integument: There is a 4 x 4 millimeter  lesion to the left of midline on the patient's forehead.  The lesion is flesh-colored and does not appear to be attached to the underlying tissues. Mammogram: Not applicable Assessment/Plan Skin lesion: Patient has a 4 x 4 millimeter skin lesion on her forehead.  Will excise this at her request.  We discussed the fact that she will have a small scar and that she will require additional procedures if the pathology is not benign as suspected.  She will have permanent sutures which will need to be removed 5 to 7 days after the procedure.  She understands and request that I proceed.  Will schedule at her request.  Photographs were obtained by her dermatologist and are available in the chart.  Andrea Stevens 12/06/2022, 5:53 PM

## 2023-01-03 ENCOUNTER — Ambulatory Visit: Payer: BC Managed Care – PPO | Admitting: Plastic Surgery

## 2023-01-03 DIAGNOSIS — D2339 Other benign neoplasm of skin of other parts of face: Secondary | ICD-10-CM

## 2023-01-03 DIAGNOSIS — L989 Disorder of the skin and subcutaneous tissue, unspecified: Secondary | ICD-10-CM

## 2023-01-03 NOTE — Progress Notes (Signed)
Procedure Note  Preoperative Dx: Pigmented nevus left forehead  Postoperative Dx: Same  Procedure: Excision of nevus  Anesthesia: Lidocaine 1% with 1:100,000 epinephrine and 0.25% Sensorcaine   Indication for Procedure: Removal for pathologic diagnosis  Description of Procedure: Risks and complications were explained to the patient including the need for additional procedures based on pathologic diagnosis.  Consent was confirmed and the patient understands the risks and benefits.  The potential complications and alternatives were explained and the patient consents.  The patient expressed understanding the option of not having the procedure and the risks of a scar.  Time out was called and all information was confirmed to be correct.    The area was prepped and drapped.  Local anesthetic was injected in the subcutaneous tissues.  After waiting for the local to take affect an elliptical transverse incision was made around the lesion..  After obtaining hemostasis, the surgical wound was closed with interrupted 5-0 Prolene sutures.  The surgical wound measured 1 cm.  A dressing was applied.  The patient was given instructions on how to care for the area and a follow up appointment.  Andrea Stevens tolerated the procedure well and there were no complications. The specimen was sent to pathology.

## 2023-01-05 LAB — DERMATOLOGY PATHOLOGY

## 2023-01-08 ENCOUNTER — Encounter: Payer: Self-pay | Admitting: Plastic Surgery

## 2023-01-08 ENCOUNTER — Ambulatory Visit: Payer: BC Managed Care – PPO | Admitting: Plastic Surgery

## 2023-01-08 VITALS — BP 118/81 | HR 69

## 2023-01-08 DIAGNOSIS — D229 Melanocytic nevi, unspecified: Secondary | ICD-10-CM

## 2023-01-08 NOTE — Progress Notes (Signed)
Andrea Stevens returns today 1 week after excision of a nevus on the left upper forehead.  She is doing well with no complaints.  I reviewed her pathology with her today.  Pathology showed a melanocytic nevus with no atypia.  She knows that there was a positive margin.  The incision is well-approximated.  Sutures were removed today without difficulty.  We discussed scar management and the importance of sun avoidance.  She will follow-up as needed.

## 2023-01-24 ENCOUNTER — Other Ambulatory Visit: Payer: Self-pay

## 2023-04-10 ENCOUNTER — Other Ambulatory Visit: Payer: Self-pay

## 2023-04-26 ENCOUNTER — Other Ambulatory Visit: Payer: Self-pay

## 2023-06-07 ENCOUNTER — Other Ambulatory Visit: Payer: Self-pay

## 2023-06-11 ENCOUNTER — Other Ambulatory Visit: Payer: Self-pay

## 2023-09-03 ENCOUNTER — Other Ambulatory Visit: Payer: Self-pay | Admitting: Obstetrics & Gynecology

## 2023-09-03 ENCOUNTER — Encounter: Payer: Self-pay | Admitting: Nurse Practitioner

## 2023-09-03 ENCOUNTER — Other Ambulatory Visit: Payer: Self-pay

## 2023-09-03 DIAGNOSIS — Z975 Presence of (intrauterine) contraceptive device: Secondary | ICD-10-CM

## 2023-09-04 ENCOUNTER — Other Ambulatory Visit: Payer: Self-pay

## 2023-09-04 NOTE — Telephone Encounter (Signed)
 It has been over a year since I have seen her please schedule CPE or office visit to discuss and get a referral

## 2023-09-05 NOTE — Telephone Encounter (Signed)
 Spoke to pt, sch ov for 09/11/23

## 2023-09-11 ENCOUNTER — Ambulatory Visit: Admitting: Nurse Practitioner

## 2023-09-11 ENCOUNTER — Encounter: Payer: Self-pay | Admitting: Nurse Practitioner

## 2023-09-11 ENCOUNTER — Other Ambulatory Visit: Payer: Self-pay

## 2023-09-11 VITALS — BP 102/78 | HR 65 | Temp 98.2°F | Ht 63.0 in | Wt 143.0 lb

## 2023-09-11 DIAGNOSIS — E611 Iron deficiency: Secondary | ICD-10-CM | POA: Diagnosis not present

## 2023-09-11 DIAGNOSIS — F419 Anxiety disorder, unspecified: Secondary | ICD-10-CM

## 2023-09-11 DIAGNOSIS — R600 Localized edema: Secondary | ICD-10-CM | POA: Diagnosis not present

## 2023-09-11 DIAGNOSIS — F321 Major depressive disorder, single episode, moderate: Secondary | ICD-10-CM

## 2023-09-11 DIAGNOSIS — F32A Depression, unspecified: Secondary | ICD-10-CM

## 2023-09-11 DIAGNOSIS — Z131 Encounter for screening for diabetes mellitus: Secondary | ICD-10-CM

## 2023-09-11 DIAGNOSIS — E538 Deficiency of other specified B group vitamins: Secondary | ICD-10-CM

## 2023-09-11 DIAGNOSIS — E559 Vitamin D deficiency, unspecified: Secondary | ICD-10-CM | POA: Diagnosis not present

## 2023-09-11 DIAGNOSIS — Z13220 Encounter for screening for lipoid disorders: Secondary | ICD-10-CM

## 2023-09-11 MED ORDER — SERTRALINE HCL 50 MG PO TABS
ORAL_TABLET | ORAL | 0 refills | Status: DC
Start: 2023-09-11 — End: 2023-09-28
  Filled 2023-09-11: qty 23, 30d supply, fill #0

## 2023-09-11 NOTE — Patient Instructions (Addendum)
 Nice to see you today Follow up with me in 6 weeks, sooner if you need me  [facility] Ur[facility]   Address: [address], [place], [place] [address] Phone: [phone]

## 2023-09-11 NOTE — Assessment & Plan Note (Signed)
 Hx of the same. We have tried conservative therapy. Amb referral

## 2023-09-11 NOTE — Assessment & Plan Note (Signed)
Pending CBC. 

## 2023-09-11 NOTE — Assessment & Plan Note (Signed)
 Pending vitamin D level today.

## 2023-09-11 NOTE — Assessment & Plan Note (Signed)
 History of the same.  Patient contracted safety today.  No active HI/SI/AH.  Will start patient on sertraline  25 mg daily for 2 weeks then titrate up to 50 mg daily thereafter.  Patient was given resources if she starts having thoughts wanting to harm herself or others to be seen immediately.  Referral to preferred psychiatrist placed today

## 2023-09-11 NOTE — Assessment & Plan Note (Deleted)
 History of the same.  Patient contracted safety today.  No active HI/SI/AH.  Will start patient on sertraline  25 mg daily for 2 weeks then titrate up to 50 mg daily thereafter.  Patient was given resources if she starts having thoughts wanting to harm herself or others to be seen immediately.  Referral to preferred psychiatrist placed today

## 2023-09-11 NOTE — Progress Notes (Signed)
 Acute Office Visit  Subjective:     Patient ID: Andrea Stevens, female    DOB: [birth year], 23 y.o.   MRN: [id number]  Chief Complaint  Patient presents with   Annual Exam    Discuss referral to behavioral health.     HPI Patient is in today for behavioral health with a history of anxiety, depression, vitman d def, self harm   States that she has a history of the same. States that she has had a lot going on with work and life. States that she is working fulltime at [employer]. She is working night shift with them. States that she had plans to go to medical school but gears have shifted. States that she has been thinking about trying medicaoitn in the past. States that she was younger  [facility] in [place] [phone] fax number. State that she is not doing therpy currently, but is interested  States that she is sleeping  not great. States that currently she will get home and sleep for a few hours and her cat requires medication then will go back to sleep. She will get 8 hours total of sleep that is broken but more like 5-6 on average.     09/11/2023   10:59 AM 09/26/2022   10:22 AM 08/17/2022    3:30 PM  PHQ9 SCORE ONLY  PHQ-9 Total Score 14 8 8        09/11/2023   11:01 AM 09/26/2022   10:23 AM 08/17/2022    3:31 PM 06/12/2022    4:09 PM  GAD 7 : Generalized Anxiety Score  Nervous, Anxious, on Edge 1 1 1 1   Control/stop worrying 1 1 1 1   Worry too much - different things 1 1 1 1   Trouble relaxing 1 1 1 1   Restless 0 1 1 0  Easily annoyed or irritable 1 1 1  0  Afraid - awful might happen 1 0 1 0  Total GAD 7 Score 6 6 7 4   Anxiety Difficulty Somewhat difficult Not difficult at all  Somewhat difficult     Review of Systems  Constitutional:  Negative for chills and fever.  Respiratory:  Negative for shortness of breath.   Cardiovascular:  Negative for chest pain.  Neurological:  Negative for headaches.  Psychiatric/Behavioral:  Negative for hallucinations and  suicidal ideas (Passive SI). The patient does not have insomnia.         Objective:    BP 102/78   Pulse 65   Temp 98.2 F (36.8 C) (Oral)   Ht 5' 3 (1.6 m)   Wt 143 lb (64.9 kg)   SpO2 100%   BMI 25.33 kg/m    Physical Exam Vitals and nursing note reviewed.  Constitutional:      Appearance: Normal appearance.  Cardiovascular:     Rate and Rhythm: Normal rate and regular rhythm.     Heart sounds: Normal heart sounds.  Pulmonary:     Effort: Pulmonary effort is normal.     Breath sounds: Normal breath sounds.  Neurological:     Mental Status: She is alert.  Psychiatric:        Attention and Perception: Attention normal.        Mood and Affect: Mood is depressed.        Speech: Speech normal.        Behavior: Behavior is withdrawn.        Thought Content: Thought content normal.  Cognition and Memory: Cognition normal.        Judgment: Judgment normal.     No results found for any visits on 09/11/23.      Assessment & Plan:   Problem List Items Addressed This Visit       Other   Anxiety and depression   Relevant Medications   sertraline  (ZOLOFT ) 50 MG tablet   Current moderate episode of major depressive disorder (HCC) - Primary   History of the same.  Patient contracted safety today.  No active HI/SI/AH.  Will start patient on sertraline  25 mg daily for 2 weeks then titrate up to 50 mg daily thereafter.  Patient was given resources if she starts having thoughts wanting to harm herself or others to be seen immediately.  Referral to preferred psychiatrist placed today      Relevant Medications   sertraline  (ZOLOFT ) 50 MG tablet   Other Relevant Orders   Ambulatory referral to Psychiatry   CBC   Comprehensive metabolic panel with GFR   TSH   Iron deficiency   Pending CBC      Relevant Orders   CBC   Hemoglobin A1c   Vitamin D  deficiency   Pending vitamin D  level today       Relevant Orders   VITAMIN D  25 Hydroxy (Vit-D Deficiency,  Fractures)   B12 deficiency   Pending B12 level      Relevant Orders   Vitamin B12   Bilateral lower extremity edema   Hx of the same. We have tried conservative therapy. Amb referral       Relevant Orders   Ambulatory referral to Vascular Surgery   CBC   Comprehensive metabolic panel with GFR   Other Visit Diagnoses       Screening for lipid disorders         Screening for diabetes mellitus           Meds ordered this encounter  Medications   sertraline  (ZOLOFT ) 50 MG tablet    Sig: Take 0.5 tablets (25 mg total) by mouth daily for 14 days, THEN 1 tablet (50 mg total) daily for 16 days.    Dispense:  23 tablet    Refill:  0    Supervising Provider:   RANDEEN HARDY A [[id number]]    Return in about 6 weeks (around 10/23/2023) for MDD/CPE.  Adina Crandall, NP

## 2023-09-11 NOTE — Assessment & Plan Note (Signed)
Pending B12 level

## 2023-09-12 ENCOUNTER — Other Ambulatory Visit: Payer: Self-pay

## 2023-09-12 ENCOUNTER — Ambulatory Visit: Payer: Self-pay | Admitting: Nurse Practitioner

## 2023-09-12 DIAGNOSIS — E559 Vitamin D deficiency, unspecified: Secondary | ICD-10-CM

## 2023-09-12 LAB — COMPREHENSIVE METABOLIC PANEL WITH GFR
ALT: 20 IU/L (ref 0–32)
AST: 19 IU/L (ref 0–40)
Albumin: 4.4 g/dL (ref 4.0–5.0)
Alkaline Phosphatase: 58 IU/L (ref 44–121)
BUN/Creatinine Ratio: 25 — ABNORMAL HIGH (ref 9–23)
BUN: 15 mg/dL (ref 6–20)
Bilirubin Total: <0.2 mg/dL (ref 0.0–1.2)
CO2: 21 mmol/L (ref 20–29)
Calcium: 9.5 mg/dL (ref 8.7–10.2)
Chloride: 103 mmol/L (ref 96–106)
Creatinine, Ser: 0.59 mg/dL (ref 0.57–1.00)
Globulin, Total: 2.3 g/dL (ref 1.5–4.5)
Glucose: 84 mg/dL (ref 70–99)
Potassium: 3.9 mmol/L (ref 3.5–5.2)
Sodium: 137 mmol/L (ref 134–144)
Total Protein: 6.7 g/dL (ref 6.0–8.5)
eGFR: 131 mL/min/1.73 (ref >59)

## 2023-09-12 LAB — CBC
Hematocrit: 34.5 % (ref 34.0–46.6)
Hemoglobin: 11.4 g/dL (ref 11.1–15.9)
MCH: 28.4 pg (ref 26.6–33.0)
MCHC: 33 g/dL (ref 31.5–35.7)
MCV: 86 fL (ref 79–97)
Platelets: 262 x10E3/uL (ref 150–450)
RBC: 4.02 x10E6/uL (ref 3.77–5.28)
RDW: 11.8 % (ref 11.7–15.4)
WBC: 6.8 x10E3/uL (ref 3.4–10.8)

## 2023-09-12 LAB — VITAMIN B12: Vitamin B-12: 342 pg/mL (ref 232–1245)

## 2023-09-12 LAB — HEMOGLOBIN A1C
Est. average glucose Bld gHb Est-mCnc: 100 mg/dL
Hgb A1c MFr Bld: 5.1 % (ref 4.8–5.6)

## 2023-09-12 LAB — VITAMIN D 25 HYDROXY (VIT D DEFICIENCY, FRACTURES): Vit D, 25-Hydroxy: 16.3 ng/mL — ABNORMAL LOW (ref 30.0–100.0)

## 2023-09-12 LAB — TSH: TSH: 1.19 u[IU]/mL (ref 0.450–4.500)

## 2023-09-12 MED ORDER — VITAMIN D (ERGOCALCIFEROL) 1.25 MG (50000 UNIT) PO CAPS
50000.0000 [IU] | ORAL_CAPSULE | ORAL | 0 refills | Status: AC
  Filled 2023-09-12 – 2023-10-19 (×2): qty 4, 28d supply, fill #0

## 2023-09-24 ENCOUNTER — Other Ambulatory Visit: Payer: Self-pay

## 2023-09-27 ENCOUNTER — Encounter: Payer: Self-pay | Admitting: Nurse Practitioner

## 2023-09-27 DIAGNOSIS — F321 Major depressive disorder, single episode, moderate: Secondary | ICD-10-CM

## 2023-09-28 ENCOUNTER — Other Ambulatory Visit: Payer: Self-pay

## 2023-09-28 MED ORDER — SERTRALINE HCL 50 MG PO TABS
50.0000 mg | ORAL_TABLET | Freq: Every day | ORAL | 1 refills | Status: AC
  Filled 2023-09-28 – 2023-10-19 (×2): qty 30, 30d supply, fill #0
  Filled 2023-11-27: qty 30, 30d supply, fill #1

## 2023-10-08 ENCOUNTER — Other Ambulatory Visit: Payer: Self-pay

## 2023-10-12 ENCOUNTER — Ambulatory Visit (INDEPENDENT_AMBULATORY_CARE_PROVIDER_SITE_OTHER): Payer: Self-pay | Admitting: Nurse Practitioner

## 2023-10-12 ENCOUNTER — Encounter (INDEPENDENT_AMBULATORY_CARE_PROVIDER_SITE_OTHER): Payer: Self-pay | Admitting: Nurse Practitioner

## 2023-10-12 VITALS — BP 118/79 | Ht 63.0 in | Wt 136.0 lb

## 2023-10-12 DIAGNOSIS — R600 Localized edema: Secondary | ICD-10-CM | POA: Diagnosis not present

## 2023-10-14 ENCOUNTER — Encounter (INDEPENDENT_AMBULATORY_CARE_PROVIDER_SITE_OTHER): Payer: Self-pay | Admitting: Nurse Practitioner

## 2023-10-14 NOTE — Progress Notes (Signed)
 Subjective:    Patient ID: Andrea Stevens, female    DOB: 2000/10/25, 23 y.o.   MRN: [id number] Chief Complaint  Patient presents with   New Patient (Initial Visit)    Patient is seen for evaluation of leg swelling. The patient first noticed the swelling remotely but is now concerned because of a significant increase in the overall edema.  The patient notes that it has been going on for approximately 2 years. The patient notes that in the morning the legs are improved but they steadily worsened throughout the course of the day. Elevation seems to make the swelling of the legs better, dependency makes them much worse.  She also notes that there are times when it seems that the knees and ankles are more affected.  There is no history of ulcerations associated with the swelling.   The patient has worn graduated compression however she feels that it was painful and has not worn them consistently.  The patient has no had any past angiography, interventions or vascular surgery.  The patient denies a history of DVT or PE. There is no prior history of phlebitis. There is no history of primary lymphedema.     Review of Systems  Cardiovascular:  Positive for leg swelling.  All other systems reviewed and are negative.      Objective:   Physical Exam Vitals reviewed.  HENT:     Head: Normocephalic.  Cardiovascular:     Rate and Rhythm: Normal rate.     Pulses: Normal pulses.  Pulmonary:     Effort: Pulmonary effort is normal.  Skin:    General: Skin is warm and dry.  Neurological:     Mental Status: She is alert and oriented to person, place, and time.  Psychiatric:        Mood and Affect: Mood normal.        Behavior: Behavior normal.        Thought Content: Thought content normal.        Judgment: Judgment normal.     BP 118/79   Ht 5' 3 (1.6 m)   Wt 136 lb (61.7 kg)   BMI 24.09 kg/m   Past Medical History:  Diagnosis Date   Allergy    Anorexia    age 32 yo as of 23  y.o not active    Chronic headaches    Depression, major, single episode    Eating disorder 2015   resolved.   Migraines    Mononucleosis    in 2023   PONV (postoperative nausea and vomiting)    UTI (urinary tract infection)     Social History   Socioeconomic History   Marital status: Single    Spouse name: Not on file   Number of children: 0   Years of education: Not on file   Highest education level: Not on file  Occupational History   Not on file  Tobacco Use   Smoking status: Never   Smokeless tobacco: Never  Vaping Use   Vaping status: Never Used  Substance and Sexual Activity   Alcohol use: Yes    Comment: occ   Drug use: No   Sexual activity: Yes    Partners: Male    Birth control/protection: I.U.D.  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   Social Drivers of Health   Financial Resource Strain: Not on file  Food Insecurity: Not on file  Transportation Needs: Not on file  Physical Activity: Not on file  Stress: Not on file  Social Connections: Not on file  Intimate Partner Violence: Not on file    Past Surgical History:  Procedure Laterality Date   FOOT SURGERY     tailors bunion 2019   GANGLION CYST EXCISION Left 07/30/2018   Procedure: REMOVAL GANGLION OF LEFT DORSAL WRIST;  Surgeon: Murrell Drivers, MD;  Location: [facility];  Service: Orthopedics;  Laterality: Left;  bier block   TONSILLECTOMY     2008   TONSILLECTOMY AND ADENOIDECTOMY     TYMPANOSTOMY TUBE PLACEMENT     WISDOM TOOTH EXTRACTION      Family History  Problem Relation Age of Onset   Diabetes Mother        type 1   Heart disease Mother    Arthritis Mother    Learning disabilities Mother        dyslexia   Miscarriages / Stillbirths Mother    Allergies Father    Kidney disease  Father    Hypertension Maternal Grandmother    Anxiety disorder Maternal Grandmother    Hyperlipidemia Maternal Grandmother    Mental illness Maternal Grandmother    Diabetes Maternal Grandfather    Hypertension Maternal Grandfather    Arthritis Maternal Grandfather    Hearing loss Maternal Grandfather    Heart disease Maternal Grandfather    Hyperlipidemia Maternal Grandfather    Parkinson's disease Maternal Grandfather    Heart disease Paternal Grandmother    Arthritis Paternal Grandmother    Hypertension Paternal Grandmother    Cancer Paternal Grandfather        skin melanoma    Allergies  Allergen Reactions   Fentanyl      Nausea    Oxycodone  Nausea Only    dizziness   Versed  [Midazolam ]     nausea       Latest Ref Rng & Units 09/11/2023   11:30 AM 06/13/2022    3:33 PM 06/06/2022   10:51 AM  CBC  WBC 3.4 - 10.8 x10E3/uL 6.8  4.7    4.5  3.5   Hemoglobin 11.1 - 15.9 g/dL 88.5  87.5    87.1  87.5   Hematocrit 34.0 - 46.6 % 34.5  37.2    38.7  37.3   Platelets 150 - 450 x10E3/uL 262  396    382  239       CMP     Component Value Date/Time   NA 137 09/11/2023 1130   K 3.9 09/11/2023 1130   CL 103 09/11/2023 1130   CO2 21 09/11/2023 1130   GLUCOSE 84 09/11/2023 1130   GLUCOSE 77 02/16/2021 1407   BUN 15 09/11/2023 1130   CREATININE 0.59 09/11/2023 1130   CALCIUM 9.5 09/11/2023 1130   PROT 6.7 09/11/2023 1130   ALBUMIN 4.4 09/11/2023 1130   AST 19 09/11/2023 1130   ALT 20 09/11/2023 1130   ALKPHOS 58 09/11/2023 1130   BILITOT <0.2 09/11/2023 1130   GFR 126.37 02/16/2021 1407   EGFR 131 09/11/2023 1130   GFRNONAA 100 01/13/2020 1508     No results found.     Assessment & Plan:   1. Bilateral lower extremity edema (Primary) Recommend:  I have had a long discussion with the patient regarding swelling  and why it  causes symptoms.  Patient will begin wearing graduated compression on a daily basis a prescription was given. The patient will  wear  the stockings first thing in the morning and removing them in the evening. The patient is instructed specifically not to sleep in the stockings.   In addition, behavioral modification will be initiated.  This will include frequent elevation, use of over the counter pain medications and exercise such as walking.  Consideration for a lymph pump will also be made based upon the effectiveness of conservative therapy.  This would help to improve the edema control and prevent sequela such as ulcers and infections   Patient should undergo duplex ultrasound of the venous system to ensure that DVT or reflux is not present.  The patient will follow-up with me after the ultrasound.    Current Outpatient Medications on File Prior to Visit  Medication Sig Dispense Refill   ferrous sulfate 325 (65 FE) MG tablet Take by mouth.     fluticasone (FLONASE) 50 MCG/ACT nasal spray Place into both nostrils daily.     Levonorgestrel  ([name] ) 19.5 MG IUD by Intrauterine route.     norethindrone -ethinyl estradiol  1/35 ([name]  1/35) tablet Take 1 tablet by mouth daily. 84 tablet 5   sertraline  (ZOLOFT ) 50 MG tablet Take 1 tablet (50 mg total) by mouth daily. 30 tablet 1   tretinoin  (RETIN-A ) 0.025 % cream      tretinoin  (RETIN-A ) 0.025 % cream Apply 1 Application topically at bedtime. 45 g 11   Vitamin D , Ergocalciferol , (DRISDOL ) 1.25 MG (50000 UNIT) CAPS capsule Take 1 capsule (50,000 Units total) by mouth every 7 (seven) days. 12 capsule 0   No current facility-administered medications on file prior to visit.    There are no Patient Instructions on file for this visit. No follow-ups on file.   Margaurite Salido E Lorrine Killilea, NP

## 2023-10-15 ENCOUNTER — Encounter: Payer: Self-pay | Admitting: *Deleted

## 2023-10-15 DIAGNOSIS — F4010 Social phobia, unspecified: Secondary | ICD-10-CM | POA: Diagnosis not present

## 2023-10-15 DIAGNOSIS — F331 Major depressive disorder, recurrent, moderate: Secondary | ICD-10-CM | POA: Diagnosis not present

## 2023-10-16 ENCOUNTER — Other Ambulatory Visit: Payer: Self-pay | Admitting: Obstetrics & Gynecology

## 2023-10-16 DIAGNOSIS — N921 Excessive and frequent menstruation with irregular cycle: Secondary | ICD-10-CM

## 2023-10-17 ENCOUNTER — Other Ambulatory Visit: Payer: Self-pay

## 2023-10-17 MED ORDER — NYLIA 1/35 1-35 MG-MCG PO TABS
1.0000 | ORAL_TABLET | Freq: Every day | ORAL | 5 refills | Status: AC
  Filled 2023-10-17: qty 28, 28d supply, fill #0
  Filled 2023-11-15: qty 28, 28d supply, fill #1
  Filled 2023-12-23: qty 28, 28d supply, fill #2
  Filled 2024-03-09: qty 28, 28d supply, fill #3

## 2023-10-19 ENCOUNTER — Other Ambulatory Visit: Payer: Self-pay

## 2023-10-26 DIAGNOSIS — F331 Major depressive disorder, recurrent, moderate: Secondary | ICD-10-CM | POA: Diagnosis not present

## 2023-10-31 ENCOUNTER — Other Ambulatory Visit: Payer: Self-pay | Admitting: Medical Genetics

## 2023-10-31 ENCOUNTER — Encounter: Payer: Self-pay | Admitting: Nurse Practitioner

## 2023-10-31 ENCOUNTER — Ambulatory Visit: Admitting: Nurse Practitioner

## 2023-10-31 VITALS — BP 110/60 | HR 65 | Temp 97.8°F | Ht 62.5 in | Wt 140.8 lb

## 2023-10-31 DIAGNOSIS — E559 Vitamin D deficiency, unspecified: Secondary | ICD-10-CM | POA: Diagnosis not present

## 2023-10-31 DIAGNOSIS — F321 Major depressive disorder, single episode, moderate: Secondary | ICD-10-CM | POA: Diagnosis not present

## 2023-10-31 DIAGNOSIS — R350 Frequency of micturition: Secondary | ICD-10-CM

## 2023-10-31 DIAGNOSIS — Z23 Encounter for immunization: Secondary | ICD-10-CM

## 2023-10-31 DIAGNOSIS — Z0000 Encounter for general adult medical examination without abnormal findings: Secondary | ICD-10-CM

## 2023-10-31 DIAGNOSIS — Z0001 Encounter for general adult medical examination with abnormal findings: Secondary | ICD-10-CM | POA: Diagnosis not present

## 2023-10-31 DIAGNOSIS — R600 Localized edema: Secondary | ICD-10-CM

## 2023-10-31 LAB — POCT URINALYSIS DIPSTICK
Bilirubin, UA: NEGATIVE
Blood, UA: NEGATIVE
Glucose, UA: NEGATIVE
Ketones, UA: NEGATIVE
Leukocytes, UA: NEGATIVE
Nitrite, UA: NEGATIVE
Protein, UA: NEGATIVE
Spec Grav, UA: 1.02 (ref 1.010–1.025)
Urobilinogen, UA: 0.2 U/dL
pH, UA: 6 (ref 5.0–8.0)

## 2023-10-31 NOTE — Progress Notes (Signed)
 Established Patient Office Visit  Subjective   Patient ID: Andrea Stevens, female    DOB: 2000-09-10  Age: 23 y.o. MRN: [id number]  Chief Complaint  Patient presents with   Annual Exam    Would like flu shot.     HPI  MDD: was last seen by me and started on zoloft . 25mg  every day for 2 weeks and then 50mg   Every day there after. She was referred to pyschairty. States ethat she did experience nausea the first couple of weeks. She does have some nausea intermittently. She also had diarrhea and headache that has improved. States that she is feeling better. States that she is OK but not happy. She has heard from pyschiartirst. They did have providers but they are out of town and would require in perons visits. She has started    for complete physical and follow up of chronic conditions.  Immunizations: -Tetanus: Completed in 2022 -Influenza: update today  -Shingles: too young  -Pneumonia: too young -hpv: up to date -covid: original and booster    Diet: Fair diet. She is eating less since starting the sertraline . States that she thinks it is driven from the nusea. She is eating 2 meals and a snack.  She is doing some water. But does energy drinks (1 a day), soda sometimes Exercise: No regular exercise. She is walks a lot with   Eye exam: needs updating. Glasses  Dental exam: Completes semi-annually    Colonoscopy: Too young, currently average risk Lung Cancer Screening: N/A  Pap smear: 08/17/2022, followed by GYN  Mammogram: breast cancer in paternal great grandmother, Paternal [place] aunt.  Patient currently average risk to young  Dexa: too young   Sleep: goes to bed and gets a few hours of sleep, gets up to medicate her cat, then sleep for another few hours. 6 hours of broken sleep.  Urinary frequency: started approx 1 weeks ago. She has nausea. She is having some lower back pain. She thinks she has a UTI. She is working on proper hydration. She has ot tried anything over  the counter yet     Review of Systems  Constitutional:  Negative for chills and fever.  Respiratory:  Negative for shortness of breath.   Cardiovascular:  Negative for chest pain and leg swelling.  Gastrointestinal:  Negative for abdominal pain, blood in stool, constipation, diarrhea, nausea and vomiting.       BM 3 times a week.   Genitourinary:  Positive for frequency and urgency. Negative for dysuria and hematuria.  Musculoskeletal:  Positive for back pain.  Neurological:  Negative for tingling and headaches.  Psychiatric/Behavioral:  Negative for hallucinations and suicidal ideas.       Objective:     BP 110/60   Pulse 65   Temp 97.8 F (36.6 C) (Oral)   Ht 5' 2.5 (1.588 m)   Wt 140 lb 12.8 oz (63.9 kg)   LMP 10/07/2023 (Approximate)   SpO2 99%   BMI 25.34 kg/m  BP Readings from Last 3 Encounters:  10/31/23 110/60  10/12/23 118/79  09/11/23 102/78   Wt Readings from Last 3 Encounters:  10/31/23 140 lb 12.8 oz (63.9 kg)  10/12/23 136 lb (61.7 kg)  09/11/23 143 lb (64.9 kg)   SpO2 Readings from Last 3 Encounters:  10/31/23 99%  09/11/23 100%  01/08/23 98%      Physical Exam Vitals and nursing note reviewed.  Constitutional:      Appearance: Normal appearance.  HENT:  Right Ear: Tympanic membrane, ear canal and external ear normal.     Left Ear: Tympanic membrane, ear canal and external ear normal.     Mouth/Throat:     Mouth: Mucous membranes are moist.     Pharynx: Oropharynx is clear.  Eyes:     Extraocular Movements: Extraocular movements intact.     Pupils: Pupils are equal, round, and reactive to light.  Cardiovascular:     Rate and Rhythm: Normal rate and regular rhythm.     Pulses: Normal pulses.     Heart sounds: Normal heart sounds.  Pulmonary:     Effort: Pulmonary effort is normal.     Breath sounds: Normal breath sounds.  Abdominal:     General: Bowel sounds are normal. There is no distension.     Palpations: There is no mass.      Tenderness: There is no abdominal tenderness. There is no right CVA tenderness or left CVA tenderness.     Hernia: No hernia is present.  Musculoskeletal:        General: Tenderness present.     Lumbar back: Tenderness present. No bony tenderness. Negative right straight leg raise test and negative left straight leg raise test.       Back:     Right lower leg: No edema.     Left lower leg: No edema.  Lymphadenopathy:     Cervical: No cervical adenopathy.  Skin:    General: Skin is warm.  Neurological:     General: No focal deficit present.     Mental Status: She is alert.     Deep Tendon Reflexes:     Reflex Scores:      Bicep reflexes are 2+ on the right side and 2+ on the left side.      Patellar reflexes are 2+ on the right side and 2+ on the left side.    Comments: Bilateral upper and lower extremity strength 5/5  Psychiatric:        Mood and Affect: Mood normal.        Behavior: Behavior normal.        Thought Content: Thought content normal.        Judgment: Judgment normal.      Results for orders placed or performed in visit on 10/31/23  POCT urinalysis dipstick  Result Value Ref Range   Color, UA yellow    Clarity, UA clear    Glucose, UA Negative Negative   Bilirubin, UA neg    Ketones, UA neg    Spec Grav, UA 1.020 1.010 - 1.025   Blood, UA neg    pH, UA 6.0 5.0 - 8.0   Protein, UA Negative Negative   Urobilinogen, UA 0.2 0.2 or 1.0 E.U./dL   Nitrite, UA neg    Leukocytes, UA Negative Negative   Appearance     Odor        The ASCVD Risk score (Arnett DK, et al., 2019) failed to calculate for the following reasons:   The 2019 ASCVD risk score is only valid for ages 79 to 60    Assessment & Plan:   Problem List Items Addressed This Visit       Other   Current moderate episode of major depressive disorder (HCC)   Has reached out to psychiatry has not made an appointment yet.  Currently doing talk therapy and sertraline .  Having slight nausea with  sertraline  even with food and taking before bed.  Will reach  out to patient a couple weeks to consider dose increase at that juncture.      Preventative health care - Primary   Discussed age-appropriate immunizations and screening exams.  Did review patient's personal, surgical, social, family histories.  Patient is up-to-date with all age-appropriate immunizations she would like.  Update flu vaccine today.  Patient is too young for CRC screening or breast cancer screening.  Patient is followed by GYN and up-to-date on cervical cancer screening.  Patient was given information at discharge about preventative healthcare maintenance with anticipatory guidance.      Vitamin D  deficiency   History of the same currently maintained on prescription repletion.  Will do a total of 6 months on prescription then anticipate to transition to over-the-counter 1000 IUs daily of vitamin D .      Bilateral lower extremity edema   History of the same patient has been referred to vascular and has already had an appointment and pending upcoming ultrasounds      Urinary frequency   Urinary frequency and urgency.  UA negative will send off culture given symptoms.  Encourage patient to hydrate adequately      Relevant Orders   POCT urinalysis dipstick (Completed)   Urine Culture   Other Visit Diagnoses       Need for influenza vaccination       Relevant Orders   Flu vaccine trivalent PF, 6mos and older(Flulaval,Afluria,Fluarix,Fluzone) (Completed)       Return in about 3 months (around 01/30/2024) for MDD.    Adina Crandall, NP

## 2023-10-31 NOTE — Assessment & Plan Note (Signed)
 History of the same patient has been referred to vascular and has already had an appointment and pending upcoming ultrasounds

## 2023-10-31 NOTE — Patient Instructions (Signed)
 Nice to see you today I want to see you in 3 months, sooner if you need me

## 2023-10-31 NOTE — Assessment & Plan Note (Signed)
 Urinary frequency and urgency.  UA negative will send off culture given symptoms.  Encourage patient to hydrate adequately

## 2023-10-31 NOTE — Assessment & Plan Note (Signed)
 Discussed age-appropriate immunizations and screening exams.  Did review patient's personal, surgical, social, family histories.  Patient is up-to-date with all age-appropriate immunizations she would like.  Update flu vaccine today.  Patient is too young for CRC screening or breast cancer screening.  Patient is followed by GYN and up-to-date on cervical cancer screening.  Patient was given information at discharge about preventative healthcare maintenance with anticipatory guidance.

## 2023-10-31 NOTE — Assessment & Plan Note (Signed)
 History of the same currently maintained on prescription repletion.  Will do a total of 6 months on prescription then anticipate to transition to over-the-counter 1000 IUs daily of vitamin D .

## 2023-10-31 NOTE — Assessment & Plan Note (Signed)
 Has reached out to psychiatry has not made an appointment yet.  Currently doing talk therapy and sertraline .  Having slight nausea with sertraline  even with food and taking before bed.  Will reach out to patient a couple weeks to consider dose increase at that juncture.

## 2023-11-01 ENCOUNTER — Other Ambulatory Visit
Admission: RE | Admit: 2023-11-01 | Discharge: 2023-11-01 | Disposition: A | Discharge status: Home / Self Care | Payer: Self-pay | Source: Ambulatory Visit | Attending: Medical Genetics | Admitting: Medical Genetics

## 2023-11-01 DIAGNOSIS — F331 Major depressive disorder, recurrent, moderate: Secondary | ICD-10-CM | POA: Diagnosis not present

## 2023-11-02 LAB — URINE CULTURE

## 2023-11-05 ENCOUNTER — Ambulatory Visit: Payer: Self-pay | Admitting: Nurse Practitioner

## 2023-11-08 DIAGNOSIS — F331 Major depressive disorder, recurrent, moderate: Secondary | ICD-10-CM | POA: Diagnosis not present

## 2023-11-11 LAB — GENECONNECT MOLECULAR SCREEN: Genetic Analysis Overall Interpretation: NEGATIVE

## 2023-11-14 ENCOUNTER — Other Ambulatory Visit (INDEPENDENT_AMBULATORY_CARE_PROVIDER_SITE_OTHER): Payer: Self-pay | Admitting: Nurse Practitioner

## 2023-11-14 ENCOUNTER — Telehealth: Payer: Self-pay | Admitting: Nurse Practitioner

## 2023-11-14 DIAGNOSIS — R600 Localized edema: Secondary | ICD-10-CM

## 2023-11-14 NOTE — Telephone Encounter (Signed)
 Left detailed voicemail for patient to call the office back or message response via mychart.

## 2023-11-14 NOTE — Telephone Encounter (Signed)
-----   Message from [facility] sent at 10/31/2023 10:22 AM EDT ----- Regarding: Mood See how mood is. Consider dose increase to 100mg  on sertraline

## 2023-11-14 NOTE — Telephone Encounter (Signed)
 See how her mood is.  We can consider increasing the sertraline  to 100mg  daily

## 2023-11-15 ENCOUNTER — Other Ambulatory Visit: Payer: Self-pay

## 2023-11-15 ENCOUNTER — Ambulatory Visit: Payer: BC Managed Care – PPO | Admitting: Dermatology

## 2023-11-15 ENCOUNTER — Encounter: Payer: Self-pay | Admitting: Dermatology

## 2023-11-15 DIAGNOSIS — L700 Acne vulgaris: Secondary | ICD-10-CM | POA: Diagnosis not present

## 2023-11-15 MED ORDER — CLINDAMYCIN PHOS-BENZOYL PEROX 1.2-5 % EX GEL
1.0000 | Freq: Every morning | CUTANEOUS | 11 refills | Status: AC
  Filled 2023-11-15: qty 45, 30d supply, fill #0

## 2023-11-15 MED ORDER — DOXYCYCLINE MONOHYDRATE 100 MG PO CAPS
100.0000 mg | ORAL_CAPSULE | Freq: Two times a day (BID) | ORAL | 1 refills | Status: AC
  Filled 2023-11-15: qty 60, 30d supply, fill #0

## 2023-11-15 MED ORDER — TRETINOIN 0.025 % EX CREA
TOPICAL_CREAM | Freq: Every day | CUTANEOUS | 11 refills | Status: AC
  Filled 2023-11-15: qty 45, 30d supply, fill #0

## 2023-11-15 NOTE — Patient Instructions (Signed)

## 2023-11-15 NOTE — Progress Notes (Signed)
   Follow-Up Visit   Subjective  Andrea Stevens is a 23 y.o. female who presents for the following: Acne face, 1 yr f/u, Tretinoin  0.025% cr qhs, getting dryness chin and mouth so d/c Cerave acne wash with Sal acid and has improved, acne improved a little, pt on BC and when she in on placebo week she flares   The following portions of the chart were reviewed this encounter and updated as appropriate: medications, allergies, medical history  Review of Systems:  No other skin or systemic complaints except as noted in HPI or Assessment and Plan.  Objective  Well appearing patient in no apparent distress; mood and affect are within normal limits.   A focused examination was performed of the following areas: face  Relevant exam findings are noted in the Assessment and Plan.    Assessment & Plan   ACNE VULGARIS  With possible Perioral Dermatitis face Exam: scattered inflammatory papules on forehead. Few on perioral skin  Chronic and persistent condition with duration or expected duration over one year. Condition is symptomatic/ bothersome to patient. Not currently at goal.   Treatment Plan: Start Duac gel qam to spot treat acne prn flares Cont Tretinoin  0.025% cr qhs to face  Start Doxycycline  100mg  1 po bid with food and drink x 2 months  Topical retinoid medications like tretinoin /Retin-A , adapalene/Differin, tazarotene/Fabior, and Epiduo/Epiduo Forte can cause dryness and irritation when first started. Only apply a pea-sized amount to the entire affected area. Avoid applying it around the eyes, edges of mouth and creases at the nose. If you experience irritation, use a good moisturizer first and/or apply the medicine less often. If you are doing well with the medicine, you can increase how often you use it until you are applying every night. Be careful with sun protection while using this medication as it can make you sensitive to the sun. This medicine should not be used by pregnant  women.   Benzoyl peroxide  can cause dryness and irritation of the skin. It can also bleach fabric. When used together with Aczone (dapsone) cream, it can stain the skin orange.  Doxycycline  should be taken with food to prevent nausea. Do not lay down for 30 minutes after taking. Be cautious with sun exposure and use good sun protection while on this medication. Pregnant women should not take this medication.        Return in about 2 months (around 01/15/2024) for Acne f/u.  I, Grayce Saunas, RMA, am acting as scribe for Boneta Sharps, MD .   Documentation: I have reviewed the above documentation for accuracy and completeness, and I agree with the above.  Boneta Sharps, MD

## 2023-11-16 ENCOUNTER — Ambulatory Visit (INDEPENDENT_AMBULATORY_CARE_PROVIDER_SITE_OTHER): Admitting: Nurse Practitioner

## 2023-11-16 ENCOUNTER — Encounter (INDEPENDENT_AMBULATORY_CARE_PROVIDER_SITE_OTHER): Payer: Self-pay | Admitting: Nurse Practitioner

## 2023-11-16 ENCOUNTER — Ambulatory Visit (INDEPENDENT_AMBULATORY_CARE_PROVIDER_SITE_OTHER)

## 2023-11-16 VITALS — BP 121/79 | HR 72 | Ht 63.0 in | Wt 140.4 lb

## 2023-11-16 DIAGNOSIS — R600 Localized edema: Secondary | ICD-10-CM

## 2023-11-21 DIAGNOSIS — F331 Major depressive disorder, recurrent, moderate: Secondary | ICD-10-CM | POA: Diagnosis not present

## 2023-11-25 ENCOUNTER — Encounter (INDEPENDENT_AMBULATORY_CARE_PROVIDER_SITE_OTHER): Payer: Self-pay | Admitting: Nurse Practitioner

## 2023-11-25 NOTE — Progress Notes (Signed)
 Subjective:    Patient ID: Andrea Stevens, female    DOB: 2000-03-11, 22 y.o.   MRN: [id number] Chief Complaint  Patient presents with   Follow-up    conv + Bilat Venous Reflux     Patient is seen for evaluation of leg swelling. The patient first noticed the swelling remotely but is now concerned because of a significant increase in the overall edema.  The patient notes that it has been going on for approximately 2 years. The patient notes that in the morning the legs are improved but they steadily worsened throughout the course of the day. Elevation seems to make the swelling of the legs better, dependency makes them much worse.  She also notes that there are times when it seems that the knees and ankles are more affected.  She does have a few menstrual cycles but no history of fibroids  There is no history of ulcerations associated with the swelling.   The patient has worn graduated compression however she feels that it was painful and has not worn them consistently.  Since she was last seen she has not taken up using compression again.  The patient has no had any past angiography, interventions or vascular surgery.  The patient denies a history of DVT or PE. There is no prior history of phlebitis. There is no history of primary lymphedema.  Today noninvasive studies show no evidence of DVT or superficial thrombophlebitis bilaterally.  No evidence of deep venous insufficiency or superficial venous reflux noted.     Review of Systems  Cardiovascular:  Positive for leg swelling.  All other systems reviewed and are negative.      Objective:   Physical Exam Vitals reviewed.  HENT:     Head: Normocephalic.  Cardiovascular:     Rate and Rhythm: Normal rate.     Pulses: Normal pulses.  Pulmonary:     Effort: Pulmonary effort is normal.  Skin:    General: Skin is warm and dry.  Neurological:     Mental Status: She is alert and oriented to person, place, and time.  Psychiatric:         Mood and Affect: Mood normal.        Behavior: Behavior normal.        Thought Content: Thought content normal.        Judgment: Judgment normal.     BP 121/79   Pulse 72   Ht 5' 3 (1.6 m)   Wt 140 lb 6.4 oz (63.7 kg)   LMP 11/10/2023 (Approximate)   BMI 24.87 kg/m   Past Medical History:  Diagnosis Date   Allergy    Anorexia    age 74 yo as of 23 y.o not active    Chronic headaches    Depression, major, single episode    Eating disorder 2015   resolved.   Migraines    Mononucleosis    in 2023   PONV (postoperative nausea and vomiting)    UTI (urinary tract infection)     Social History   Socioeconomic History   Marital status: Single    Spouse name: Not on file   Number of children: 0   Years of education: Not on file   Highest education level: Not on file  Occupational History   Not on file  Tobacco Use   Smoking status: Never   Smokeless tobacco: Never  Vaping Use   Vaping status: Never Used  Substance and Sexual Activity  Alcohol use: Yes    Comment: occ   Drug use: No   Sexual activity: Yes    Partners: Male    Birth control/protection: I.U.D.  Other Topics Concern   Not on file  Social History Narrative   1 twin sister Eleanor    Lives with sister and mom    Moved from [place]    In [place] interested in medicine pre-med 01/2021 graduated will do phlebotomy and take mcat   Social Drivers of Health   Financial Resource Strain: Not on file  Food Insecurity: Not on file  Transportation Needs: Not on file  Physical Activity: Not on file  Stress: Not on file  Social Connections: Not on file  Intimate Partner Violence: Not on file    Past Surgical History:  Procedure Laterality Date   FOOT SURGERY     tailors bunion 2019   GANGLION CYST EXCISION Left 07/30/2018   Procedure: REMOVAL GANGLION OF LEFT DORSAL WRIST;  Surgeon: Murrell Drivers, MD;  Location: [facility];  Service: Orthopedics;  Laterality: Left;  bier block    TONSILLECTOMY     2008   TONSILLECTOMY AND ADENOIDECTOMY     TYMPANOSTOMY TUBE PLACEMENT     WISDOM TOOTH EXTRACTION      Family History  Problem Relation Age of Onset   Diabetes Mother        type 1   Heart disease Mother    Arthritis Mother    Learning disabilities Mother        dyslexia   Miscarriages / Stillbirths Mother    Allergies Father    Kidney disease Father    Hypertension Maternal Grandmother    Anxiety disorder Maternal Grandmother    Hyperlipidemia Maternal Grandmother    Mental illness Maternal Grandmother    Diabetes Maternal Grandfather    Hypertension Maternal Grandfather    Arthritis Maternal Grandfather    Hearing loss Maternal Grandfather    Heart disease Maternal Grandfather    Hyperlipidemia Maternal Grandfather    Parkinson's disease Maternal Grandfather    Heart disease Paternal Grandmother    Arthritis Paternal Grandmother    Hypertension Paternal Grandmother    Cancer Paternal Grandfather        skin melanoma    Allergies  Allergen Reactions   Fentanyl      Nausea    Oxycodone  Nausea Only    dizziness   Versed  [Midazolam ]     nausea       Latest Ref Rng & Units 09/11/2023   11:30 AM 06/13/2022    3:33 PM 06/06/2022   10:51 AM  CBC  WBC 3.4 - 10.8 x10E3/uL 6.8  4.7    4.5  3.5   Hemoglobin 11.1 - 15.9 g/dL 88.5  87.5    87.1  87.5   Hematocrit 34.0 - 46.6 % 34.5  37.2    38.7  37.3   Platelets 150 - 450 x10E3/uL 262  396    382  239       CMP     Component Value Date/Time   NA 137 09/11/2023 1130   K 3.9 09/11/2023 1130   CL 103 09/11/2023 1130   CO2 21 09/11/2023 1130   GLUCOSE 84 09/11/2023 1130   GLUCOSE 77 02/16/2021 1407   BUN 15 09/11/2023 1130   CREATININE 0.59 09/11/2023 1130   CALCIUM 9.5 09/11/2023 1130   PROT 6.7 09/11/2023 1130   ALBUMIN 4.4 09/11/2023 1130   AST 19 09/11/2023 1130  ALT 20 09/11/2023 1130   ALKPHOS 58 09/11/2023 1130   BILITOT <0.2 09/11/2023 1130   GFR 126.37 02/16/2021 1407    EGFR 131 09/11/2023 1130   GFRNONAA 100 01/13/2020 1508     No results found.     Assessment & Plan:   1. Bilateral lower extremity edema (Primary) Recommend:  The patient has not had a chance to try the conservative therapies, especially as it relates to utilizing medical grade compression.  I have had a long discussion with the patient regarding swelling and why it  causes symptoms.  Patient will begin wearing graduated compression on a daily basis a prescription was given. The patient will  wear the stockings first thing in the morning and removing them in the evening. The patient is instructed specifically not to sleep in the stockings.   In addition, behavioral modification will be initiated.  This will include frequent elevation, use of over the counter pain medications and exercise such as walking.  Consideration for a lymph pump will also be made based upon the effectiveness of conservative therapy.  This would help to improve the edema control and prevent sequela such as ulcers and infections   We had a discussion regarding the possibility of fibroids causing her swelling.  She does have an upcoming evaluation with her OB/GYN.  I suspect there is a low likelihood as typically fibroids tend to cause swelling unilaterally versus bilaterally but if fibroids are noted we can always have a CT venogram done to evaluate this as a cause.   Current Outpatient Medications on File Prior to Visit  Medication Sig Dispense Refill   Clindamycin -Benzoyl Per, Refr, gel Apply 1 Application topically in the morning to face to spot treat acne 45 g 11   doxycycline  (MONODOX ) 100 MG capsule Take 1 capsule (100 mg total) by mouth 2 (two) times daily. Take with food and drink 60 capsule 1   ferrous sulfate 325 (65 FE) MG tablet Take by mouth.     fluticasone (FLONASE) 50 MCG/ACT nasal spray Place into both nostrils daily.     Levonorgestrel  ([name] ) 19.5 MG IUD by Intrauterine route.      norethindrone -ethinyl estradiol  1/35 ([name]  1/35) tablet Take 1 tablet by mouth daily. 84 tablet 5   sertraline  (ZOLOFT ) 50 MG tablet Take 1 tablet (50 mg total) by mouth daily. 30 tablet 1   tretinoin  (RETIN-A ) 0.025 % cream Apply topically at bedtime to face for acne 45 g 11   Vitamin D , Ergocalciferol , (DRISDOL ) 1.25 MG (50000 UNIT) CAPS capsule Take 1 capsule (50,000 Units total) by mouth every 7 (seven) days. 12 capsule 0   No current facility-administered medications on file prior to visit.    There are no Patient Instructions on file for this visit. No follow-ups on file.   Tayla Panozzo E Kirby Cortese, NP

## 2023-11-28 DIAGNOSIS — F331 Major depressive disorder, recurrent, moderate: Secondary | ICD-10-CM | POA: Diagnosis not present

## 2023-11-29 ENCOUNTER — Other Ambulatory Visit: Payer: Self-pay

## 2023-11-30 DIAGNOSIS — F4310 Post-traumatic stress disorder, unspecified: Secondary | ICD-10-CM | POA: Diagnosis not present

## 2023-11-30 DIAGNOSIS — F339 Major depressive disorder, recurrent, unspecified: Secondary | ICD-10-CM | POA: Diagnosis not present

## 2023-11-30 DIAGNOSIS — F411 Generalized anxiety disorder: Secondary | ICD-10-CM | POA: Diagnosis not present

## 2023-12-06 DIAGNOSIS — F331 Major depressive disorder, recurrent, moderate: Secondary | ICD-10-CM | POA: Diagnosis not present

## 2023-12-13 DIAGNOSIS — F331 Major depressive disorder, recurrent, moderate: Secondary | ICD-10-CM | POA: Diagnosis not present

## 2023-12-14 ENCOUNTER — Other Ambulatory Visit: Payer: Self-pay

## 2023-12-14 DIAGNOSIS — F339 Major depressive disorder, recurrent, unspecified: Secondary | ICD-10-CM | POA: Diagnosis not present

## 2023-12-14 MED ORDER — DESVENLAFAXINE SUCCINATE ER 25 MG PO TB24
25.0000 mg | ORAL_TABLET | Freq: Every day | ORAL | 0 refills | Status: AC
  Filled 2023-12-14 – 2023-12-24 (×4): qty 30, 30d supply, fill #0

## 2023-12-18 ENCOUNTER — Other Ambulatory Visit: Payer: Self-pay

## 2023-12-21 ENCOUNTER — Other Ambulatory Visit: Payer: Self-pay

## 2023-12-24 ENCOUNTER — Other Ambulatory Visit: Payer: Self-pay

## 2023-12-25 ENCOUNTER — Other Ambulatory Visit: Payer: Self-pay

## 2024-01-10 DIAGNOSIS — F331 Major depressive disorder, recurrent, moderate: Secondary | ICD-10-CM | POA: Diagnosis not present

## 2024-01-10 DIAGNOSIS — F4010 Social phobia, unspecified: Secondary | ICD-10-CM | POA: Diagnosis not present

## 2024-01-15 ENCOUNTER — Ambulatory Visit: Admitting: Dermatology

## 2024-01-31 DIAGNOSIS — F331 Major depressive disorder, recurrent, moderate: Secondary | ICD-10-CM | POA: Diagnosis not present

## 2024-02-12 ENCOUNTER — Ambulatory Visit (INDEPENDENT_AMBULATORY_CARE_PROVIDER_SITE_OTHER): Admitting: Nurse Practitioner
